# Patient Record
Sex: Female | Born: 1970 | Race: White | Hispanic: No | Marital: Married | Smoking: Former smoker
Health system: Southern US, Community
[De-identification: ages and names within clinical notes are randomized; demographics above are authoritative.]

## PROBLEM LIST (undated history)

## (undated) DIAGNOSIS — F410 Panic disorder [episodic paroxysmal anxiety] without agoraphobia: Secondary | ICD-10-CM

## (undated) DIAGNOSIS — E785 Hyperlipidemia, unspecified: Secondary | ICD-10-CM

## (undated) DIAGNOSIS — I10 Essential (primary) hypertension: Secondary | ICD-10-CM

## (undated) HISTORY — DX: Panic disorder (episodic paroxysmal anxiety): F41.0

## (undated) HISTORY — DX: Hyperlipidemia, unspecified: E78.5

## (undated) HISTORY — DX: Essential (primary) hypertension: I10

---

## 1993-11-18 HISTORY — PX: WISDOM TOOTH EXTRACTION: SHX21

## 1993-11-18 HISTORY — PX: BUNIONECTOMY: SHX129

## 1998-06-20 ENCOUNTER — Ambulatory Visit (HOSPITAL_COMMUNITY): Admission: RE | Admit: 1998-06-20 | Discharge: 1998-06-20 | Payer: Self-pay | Admitting: Obstetrics and Gynecology

## 1998-08-16 ENCOUNTER — Other Ambulatory Visit: Admission: RE | Admit: 1998-08-16 | Discharge: 1998-08-16 | Payer: Self-pay | Admitting: Obstetrics and Gynecology

## 1999-03-07 ENCOUNTER — Encounter: Payer: Self-pay | Admitting: Obstetrics and Gynecology

## 1999-03-07 ENCOUNTER — Inpatient Hospital Stay (HOSPITAL_COMMUNITY): Admission: AD | Admit: 1999-03-07 | Discharge: 1999-03-10 | Payer: Self-pay | Admitting: Obstetrics and Gynecology

## 1999-06-12 ENCOUNTER — Inpatient Hospital Stay (HOSPITAL_COMMUNITY): Admission: AD | Admit: 1999-06-12 | Discharge: 1999-06-15 | Payer: Self-pay | Admitting: Obstetrics and Gynecology

## 1999-06-12 ENCOUNTER — Encounter (INDEPENDENT_AMBULATORY_CARE_PROVIDER_SITE_OTHER): Payer: Self-pay

## 1999-08-07 ENCOUNTER — Encounter (HOSPITAL_COMMUNITY): Admission: RE | Admit: 1999-08-07 | Discharge: 1999-11-05 | Payer: Self-pay | Admitting: Obstetrics and Gynecology

## 1999-11-07 ENCOUNTER — Encounter (HOSPITAL_COMMUNITY): Admission: RE | Admit: 1999-11-07 | Discharge: 2000-02-05 | Payer: Self-pay | Admitting: Obstetrics and Gynecology

## 2000-03-07 ENCOUNTER — Encounter: Admission: RE | Admit: 2000-03-07 | Discharge: 2000-03-18 | Payer: Self-pay | Admitting: Obstetrics and Gynecology

## 2000-09-07 ENCOUNTER — Emergency Department (HOSPITAL_COMMUNITY): Admission: EM | Admit: 2000-09-07 | Discharge: 2000-09-07 | Payer: Self-pay | Admitting: *Deleted

## 2001-01-23 ENCOUNTER — Encounter: Payer: Self-pay | Admitting: Obstetrics and Gynecology

## 2001-01-23 ENCOUNTER — Encounter: Admission: RE | Admit: 2001-01-23 | Discharge: 2001-01-23 | Payer: Self-pay | Admitting: Obstetrics and Gynecology

## 2001-01-30 ENCOUNTER — Other Ambulatory Visit: Admission: RE | Admit: 2001-01-30 | Discharge: 2001-01-30 | Payer: Self-pay | Admitting: Obstetrics and Gynecology

## 2001-03-31 ENCOUNTER — Other Ambulatory Visit: Admission: RE | Admit: 2001-03-31 | Discharge: 2001-03-31 | Payer: Self-pay | Admitting: Obstetrics and Gynecology

## 2002-04-09 ENCOUNTER — Other Ambulatory Visit: Admission: RE | Admit: 2002-04-09 | Discharge: 2002-04-09 | Payer: Self-pay | Admitting: Obstetrics and Gynecology

## 2004-01-18 ENCOUNTER — Other Ambulatory Visit: Admission: RE | Admit: 2004-01-18 | Discharge: 2004-01-18 | Payer: Self-pay | Admitting: Obstetrics and Gynecology

## 2005-02-12 ENCOUNTER — Other Ambulatory Visit: Admission: RE | Admit: 2005-02-12 | Discharge: 2005-02-12 | Payer: Self-pay | Admitting: Obstetrics and Gynecology

## 2006-04-17 ENCOUNTER — Other Ambulatory Visit: Admission: RE | Admit: 2006-04-17 | Discharge: 2006-04-17 | Payer: Self-pay | Admitting: Obstetrics and Gynecology

## 2007-01-06 ENCOUNTER — Ambulatory Visit (HOSPITAL_COMMUNITY): Admission: RE | Admit: 2007-01-06 | Discharge: 2007-01-06 | Payer: Self-pay | Admitting: Obstetrics and Gynecology

## 2007-05-15 ENCOUNTER — Other Ambulatory Visit: Admission: RE | Admit: 2007-05-15 | Discharge: 2007-05-15 | Payer: Self-pay | Admitting: Obstetrics and Gynecology

## 2008-05-18 LAB — CONVERTED CEMR LAB: Pap Smear: NORMAL

## 2008-05-24 ENCOUNTER — Emergency Department (HOSPITAL_COMMUNITY): Admission: EM | Admit: 2008-05-24 | Discharge: 2008-05-24 | Payer: Self-pay | Admitting: Emergency Medicine

## 2008-05-27 ENCOUNTER — Other Ambulatory Visit: Admission: RE | Admit: 2008-05-27 | Discharge: 2008-05-27 | Payer: Self-pay | Admitting: Obstetrics and Gynecology

## 2008-10-07 ENCOUNTER — Emergency Department (HOSPITAL_COMMUNITY): Admission: EM | Admit: 2008-10-07 | Discharge: 2008-10-07 | Payer: Self-pay | Admitting: Emergency Medicine

## 2009-01-02 ENCOUNTER — Encounter: Payer: Self-pay | Admitting: Family Medicine

## 2009-03-16 ENCOUNTER — Ambulatory Visit: Payer: Self-pay | Admitting: Family Medicine

## 2009-03-16 DIAGNOSIS — F410 Panic disorder [episodic paroxysmal anxiety] without agoraphobia: Secondary | ICD-10-CM

## 2009-03-16 DIAGNOSIS — I10 Essential (primary) hypertension: Secondary | ICD-10-CM

## 2009-03-16 DIAGNOSIS — E785 Hyperlipidemia, unspecified: Secondary | ICD-10-CM

## 2009-03-16 HISTORY — DX: Hyperlipidemia, unspecified: E78.5

## 2009-03-16 HISTORY — DX: Essential (primary) hypertension: I10

## 2009-03-16 HISTORY — DX: Panic disorder (episodic paroxysmal anxiety): F41.0

## 2009-03-20 ENCOUNTER — Telehealth: Payer: Self-pay | Admitting: Family Medicine

## 2009-07-05 ENCOUNTER — Ambulatory Visit: Payer: Self-pay | Admitting: Family Medicine

## 2009-07-05 DIAGNOSIS — R5383 Other fatigue: Secondary | ICD-10-CM

## 2009-07-05 DIAGNOSIS — R5381 Other malaise: Secondary | ICD-10-CM

## 2009-08-02 ENCOUNTER — Ambulatory Visit: Payer: Self-pay | Admitting: Family Medicine

## 2009-08-02 DIAGNOSIS — G5600 Carpal tunnel syndrome, unspecified upper limb: Secondary | ICD-10-CM

## 2009-10-11 ENCOUNTER — Ambulatory Visit (HOSPITAL_COMMUNITY): Admission: RE | Admit: 2009-10-11 | Discharge: 2009-10-11 | Payer: Self-pay | Admitting: Obstetrics and Gynecology

## 2009-10-24 ENCOUNTER — Ambulatory Visit: Payer: Self-pay | Admitting: Family Medicine

## 2009-12-22 ENCOUNTER — Ambulatory Visit: Payer: Self-pay | Admitting: Family Medicine

## 2010-04-26 IMAGING — CR DG CHEST 2V
2 series · 2 of 2 positions shown · non-contrast
Comparison: 05/24/2008

CLINICAL DATA: Chest discomfort, chest burning

CHEST - 2 VIEW

[w chest pa]
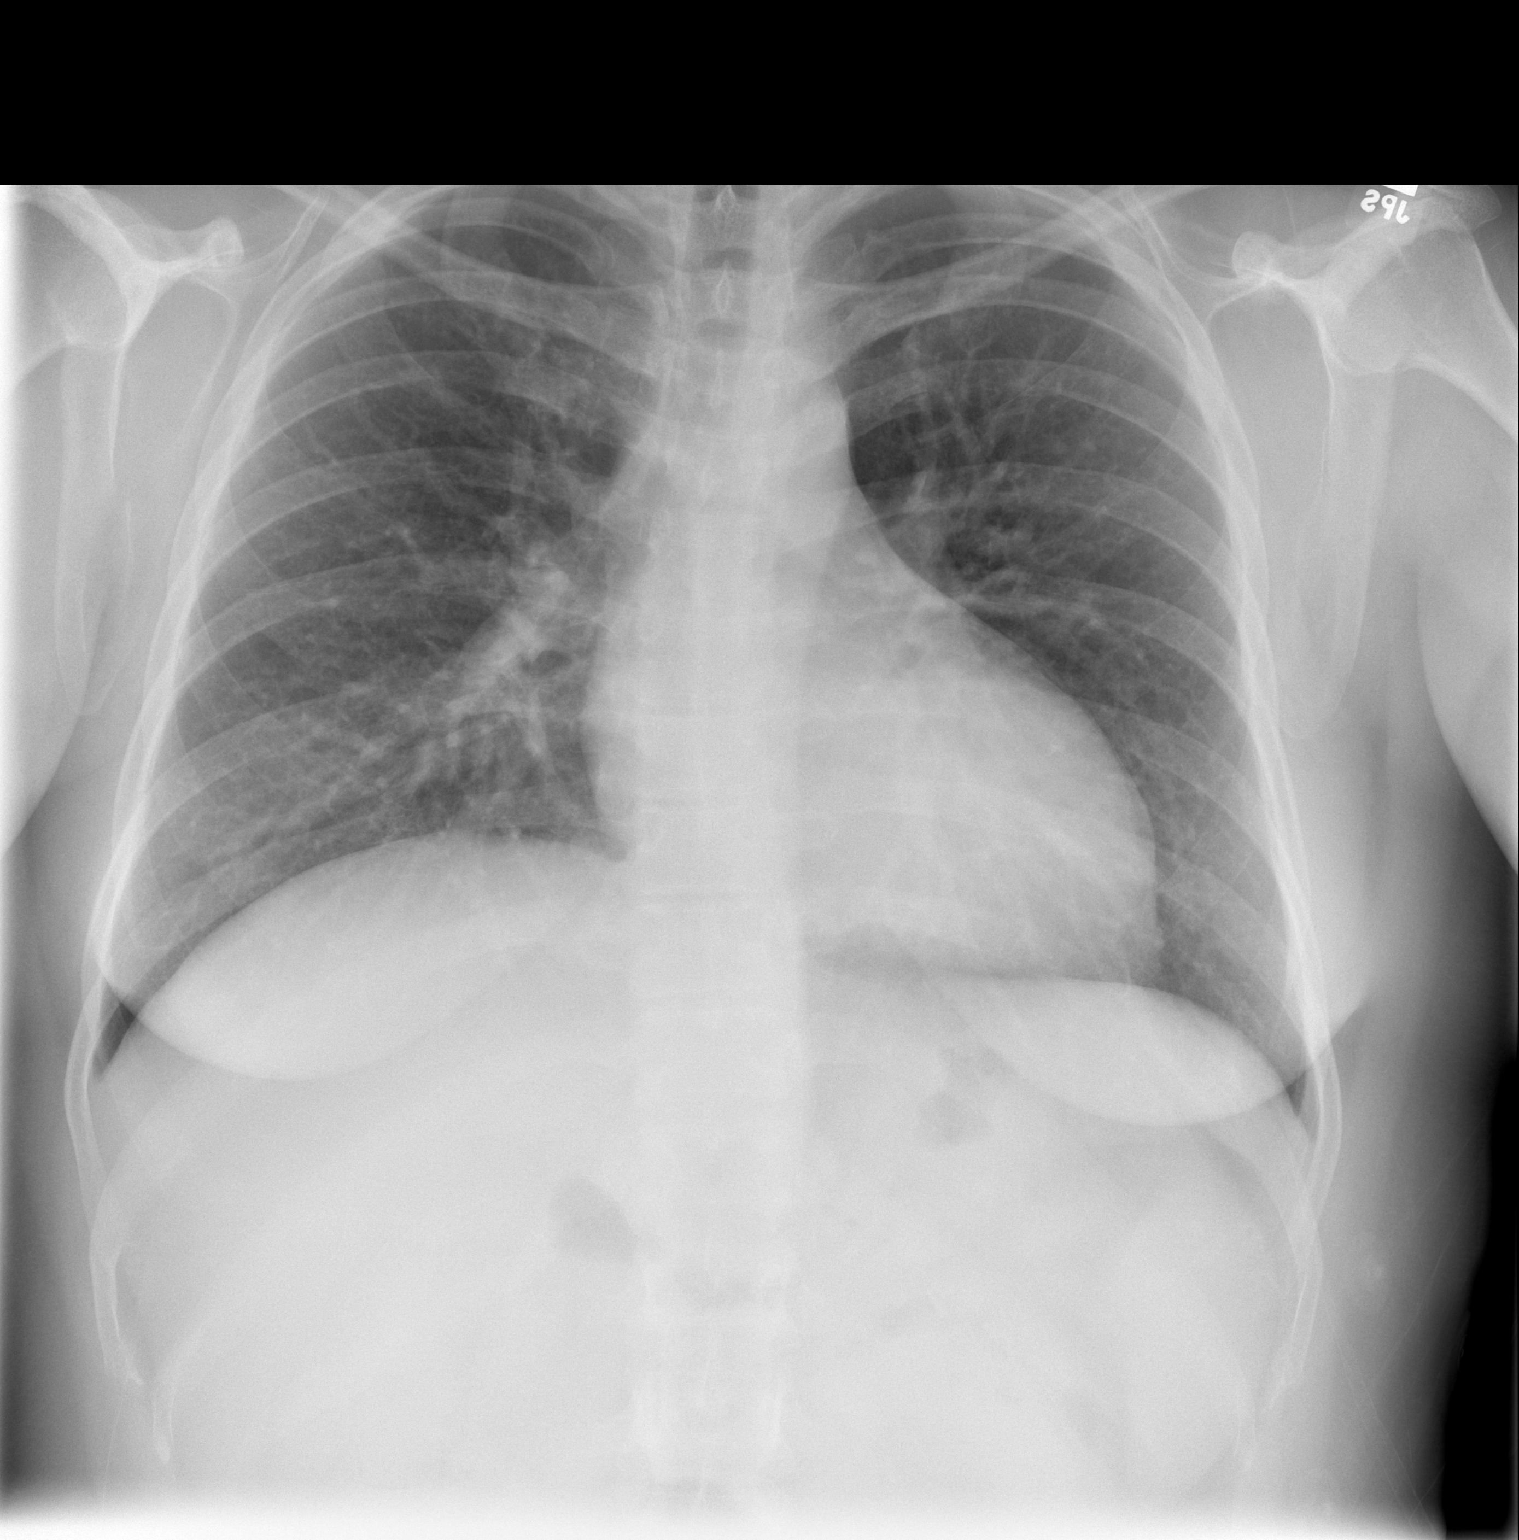

[w chest lat]
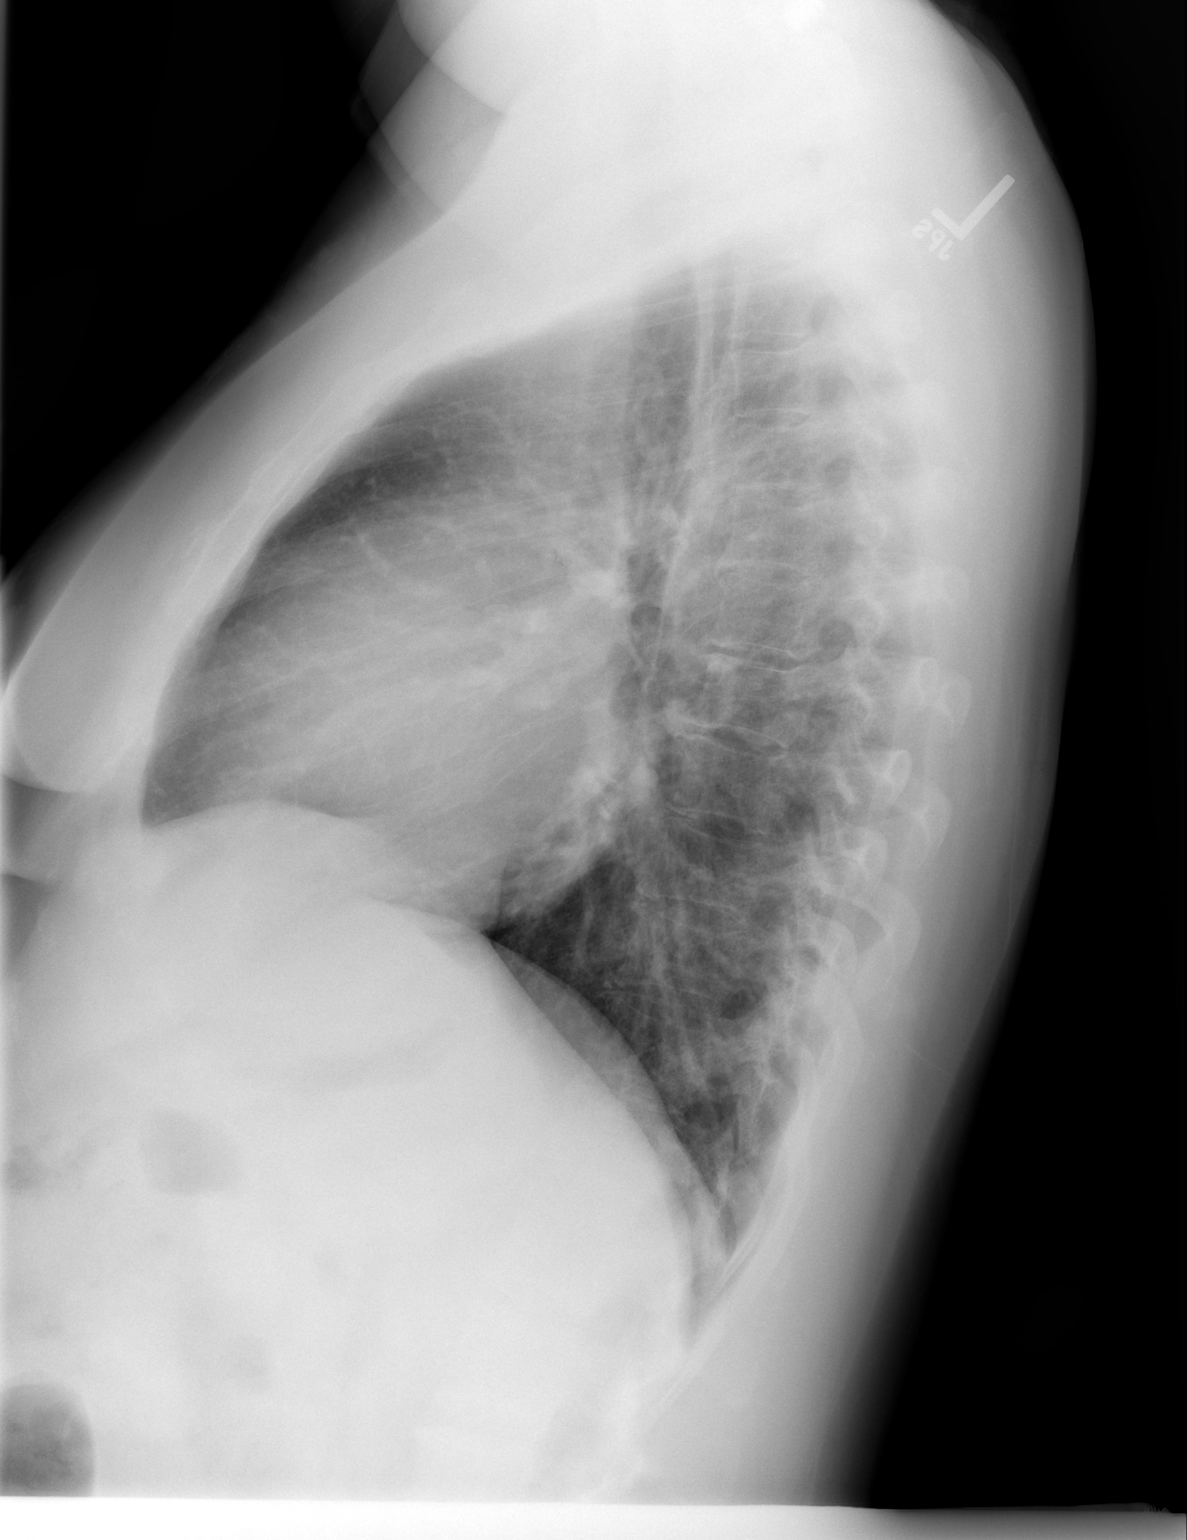

[2 of 2 positions shown; findings below may reference images not displayed]

FINDINGS: Cardiomediastinal silhouette is stable.  No acute
infiltrate or pleural effusion.  No pulmonary edema.  Mild central
increased bronchial markings is stable. Bony thorax is stable.
IMPRESSION: No active disease.  No significant change.

## 2010-05-16 ENCOUNTER — Ambulatory Visit: Payer: Self-pay | Admitting: Family Medicine

## 2010-12-18 NOTE — Assessment & Plan Note (Signed)
Summary: renew meds//ccm   Vital Signs:  Patient profile:   40 year old female Menstrual status:  irregular Weight:      204 pounds Temp:     98.9 degrees F oral BP sitting:   130 / 88  (left arm) Cuff size:   large  Vitals Entered By: Sid Falcon LPN (December 22, 2009 1:50 PM) CC: Med follow-up   History of Present Illness: Followup anxiety issues. History panic disorder. Patient feels improved on Prozac but still has some anxiety symptoms. Requesting titration. No side effects from medication. Using Ativan mostly to help with sleep at night. Still has occasional breakthrough panic symptoms at night. Also requesting refill blood pressure medication. Exercising several days per week.  Allergies (verified): No Known Drug Allergies  Past History:  Past Medical History: Last updated: 03/16/2009 Depression Hay Fever/Allergies Hyperlipidemia Hypertension UTI Panic Attacks PMH reviewed for relevance  Review of Systems      See HPI  Physical Exam  General:  Well-developed,well-nourished,in no acute distress; alert,appropriate and cooperative throughout examination Mouth:  Oral mucosa and oropharynx without lesions or exudates.  Teeth in good repair. Neck:  No deformities, masses, or tenderness noted. Lungs:  Normal respiratory effort, chest expands symmetrically. Lungs are clear to auscultation, no crackles or wheezes. Heart:  Normal rate and regular rhythm. S1 and S2 normal without gallop, murmur, click, rub or other extra sounds. Psych:  normally interactive, good eye contact, not anxious appearing, and not depressed appearing.     Impression & Recommendations:  Problem # 1:  PANIC DISORDER (ICD-300.01) Assessment Improved titrate prozac to 40 mg. Her updated medication list for this problem includes:    Fluoxetine Hcl 40 Mg Caps (Fluoxetine hcl) ..... One by mouth once daily    Ativan 1 Mg Tabs (Lorazepam) .Marland Kitchen... As needed  Problem # 2:  HYPERTENSION  (ICD-401.9) Assessment: Unchanged  Her updated medication list for this problem includes:    Ziac 2.5-6.25 Mg Tabs (Bisoprolol-hydrochlorothiazide) ..... Once daily  Complete Medication List: 1)  Ziac 2.5-6.25 Mg Tabs (Bisoprolol-hydrochlorothiazide) .... Once daily 2)  Fluoxetine Hcl 40 Mg Caps (Fluoxetine hcl) .... One by mouth once daily 3)  Ativan 1 Mg Tabs (Lorazepam) .... As needed  Patient Instructions: 1)  Please schedule a follow-up appointment in 6 months .  2)  It is important that you exercise reguarly at least 20 minutes 5 times a week. If you develop chest pain, have severe difficulty breathing, or feel very tired, stop exercising immediately and seek medical attention.  3)  You need to lose weight. Consider a lower calorie diet and regular exercise.  Prescriptions: ZIAC 2.5-6.25 MG TABS (BISOPROLOL-HYDROCHLOROTHIAZIDE) once daily  #30 x 11   Entered and Authorized by:   Evelena Peat MD   Signed by:   Evelena Peat MD on 12/22/2009   Method used:   Electronically to        [facility]  [phone]* (retail)       [address]       [place], [place]  [address]       Ph: [phone] or [phone]       Fax: [phone]   RxID:   [id number] ATIVAN 1 MG TABS (LORAZEPAM) as needed  #30 x 5   Entered and Authorized by:   Evelena Peat MD   Signed by:   Evelena Peat MD on 12/22/2009   Method used:   Print then Give to  Patient   RxID:   [id number] FLUOXETINE HCL 40 MG CAPS (FLUOXETINE HCL) one by mouth once daily  #30 x 11   Entered and Authorized by:   Evelena Peat MD   Signed by:   Evelena Peat MD on 12/22/2009   Method used:   Electronically to        [facility]  [phone]* (retail)       [address]       [place], [place]  [address]       Ph: [phone] or [phone]       Fax: [phone]   RxID:   [id number]

## 2010-12-18 NOTE — Assessment & Plan Note (Addendum)
Summary: med check/consult/cjr   Vital Signs:  Patient profile:   40 year old female Menstrual status:  irregular Weight:      199 pounds Temp:     98.4 degrees F oral BP sitting:   116 / 72  Vitals Entered By: Sid Falcon LPN (May 16, 2010 8:53 AM)  History of Present Illness: Episode last week of palpitations at rest and anxiety symptoms. No signif episodes since then.  No chest pain.  Hx panic disorder and for most part controlled on Prozac.  Still feels anxious but no full blown panic attacks.    hypertension treated with Ziac and well controlled.  No side effects from medication.  Compliant with meds.  Hypertension History:      She complains of palpitations, but denies headache, chest pain, dyspnea with exertion, orthopnea, PND, peripheral edema, visual symptoms, neurologic problems, syncope, and side effects from treatment.  She notes no problems with any antihypertensive medication side effects.        Positive major cardiovascular risk factors include hyperlipidemia and hypertension.  Negative major cardiovascular risk factors include female age less than 77 years old.     Allergies (verified): No Known Drug Allergies  Past History:  Past Medical History: Last updated: 03/16/2009 Depression Hay Fever/Allergies Hyperlipidemia Hypertension UTI Panic Attacks  Social History: Last updated: 03/16/2009 Occupation:  Admin Asst Married Alcohol use-no Previous smoker  Review of Systems  The patient denies anorexia, weight loss, weight gain, chest pain, syncope, dyspnea on exertion, peripheral edema, prolonged cough, headaches, and abdominal pain.    Physical Exam  General:  Well-developed,well-nourished,in no acute distress; alert,appropriate and cooperative throughout examination Head:  Normocephalic and atraumatic without obvious abnormalities. No apparent alopecia or balding. Mouth:  Oral mucosa and oropharynx without lesions or exudates.  Teeth in good  repair. Neck:  No deformities, masses, or tenderness noted. Lungs:  Normal respiratory effort, chest expands symmetrically. Lungs are clear to auscultation, no crackles or wheezes. Heart:  Normal rate and regular rhythm. S1 and S2 normal without gallop, murmur, click, rub or other extra sounds. Extremities:  no edema. Psych:  normally interactive, good eye contact, not anxious appearing, and not depressed appearing.     Impression & Recommendations:  Problem # 1:  HYPERTENSION (ICD-401.9) stable. cont current meds. Her updated medication list for this problem includes:    Ziac 2.5-6.25 Mg Tabs (Bisoprolol-hydrochlorothiazide) ..... Once daily  Problem # 2:  PANIC DISORDER (ICD-300.01) for the most past controlled.  cont to supplement with Lorazepam as needed.  Her updated medication list for this problem includes:    Fluoxetine Hcl 40 Mg Caps (Fluoxetine hcl) ..... One by mouth once daily    Ativan 1 Mg Tabs (Lorazepam) .Marland Kitchen... As needed  Complete Medication List: 1)  Ziac 2.5-6.25 Mg Tabs (Bisoprolol-hydrochlorothiazide) .... Once daily 2)  Fluoxetine Hcl 40 Mg Caps (Fluoxetine hcl) .... One by mouth once daily 3)  Ativan 1 Mg Tabs (Lorazepam) .... As needed 4)  Clobex Spray 0.05 % Liqd (Clobetasol propionate) .... Spray to ankles and ears as needed  for psoraisis  Hypertension Assessment/Plan:      The patient's hypertensive risk group is category B: At least one risk factor (excluding diabetes) with no target organ damage.  Today's blood pressure is 116/72.    Patient Instructions: 1)  Please schedule a follow-up appointment in 6 months .

## 2010-12-31 ENCOUNTER — Telehealth: Payer: Self-pay | Admitting: *Deleted

## 2010-12-31 NOTE — Telephone Encounter (Signed)
Pt calls stating she took the test for an UTI and was positive.  Would like to be treated as she cannot come into the office. Per Dr. Caryl Never, must come into office.  Could not come until Wed.  Appt scheduled.

## 2010-12-31 NOTE — Telephone Encounter (Signed)
[facility] Triage Call Report Triage Record Num: [id number] Operator: Karenann Cai Patient Name: [facility] Call Date & Time: 12/30/2010 1:59:01PM Patient Phone: [phone] PCP: Evelena Peat Patient Gender: Female PCP Fax : [phone] Patient DOB: 03/03/1971 [facility] Name: [facility] Reason for Call: Patient is calling to report that she has urinary frequency and burning x 48 hours. Afebrile. LMP: ablation. RN reviewed urinary symptoms (female) care advice with patient. Patient advised to go to UC today or call office staff in the am for an appointment. Protocol(s) Used: Urinary Symptoms - Female Recommended Outcome per Protocol: See Provider within 24 hours Reason for Outcome: Has one or more urinary tract symptoms Care Advice: ~ 12/30/2010 2:05:32PM Page 1 of 1 [email or web address]

## 2011-01-01 ENCOUNTER — Encounter: Payer: Self-pay | Admitting: Family Medicine

## 2011-01-02 ENCOUNTER — Encounter: Payer: Self-pay | Admitting: Family Medicine

## 2011-01-02 ENCOUNTER — Ambulatory Visit (INDEPENDENT_AMBULATORY_CARE_PROVIDER_SITE_OTHER): Payer: 59 | Admitting: Family Medicine

## 2011-01-02 VITALS — BP 130/88 | Temp 98.2°F | Ht 62.25 in | Wt 202.0 lb

## 2011-01-02 DIAGNOSIS — R300 Dysuria: Secondary | ICD-10-CM

## 2011-01-02 DIAGNOSIS — F410 Panic disorder [episodic paroxysmal anxiety] without agoraphobia: Secondary | ICD-10-CM

## 2011-01-02 LAB — POCT URINALYSIS DIPSTICK
Bilirubin, UA: NEGATIVE
Glucose, UA: NEGATIVE
Ketones, UA: NEGATIVE
Nitrite, UA: NEGATIVE

## 2011-01-02 MED ORDER — FLUOXETINE HCL 40 MG PO CAPS: 40.0000 mg | ORAL_CAPSULE | Freq: Every day | ORAL | Status: DC

## 2011-01-02 MED ORDER — NITROFURANTOIN MONOHYD MACRO 100 MG PO CAPS: 100.0000 mg | ORAL_CAPSULE | Freq: Two times a day (BID) | ORAL | Status: AC

## 2011-01-02 NOTE — Patient Instructions (Signed)
Urinary Tract Infection (UTI)   Infections in the urinary tract can start in several places. A bladder infection (cystitis), a kidney infection (pyelonephritis), and a prostate infection (prostatitis) are different types of urinary tract infections. They usually get better if treated with antibiotics. Antibiotics are medicines that can help kill bacteria germs. Take all the medicine given to you until it is gone. You may feel better in a few days, but TAKE ALL MEDICINE or the infection may not respond and become more difficult to treat.    HOME CARE INSTRUCTIONS  Drink a lot of fluid, 3 to 4 quarts a day. Cranberry juice is especially recommended, in addition to large amounts of water.  Avoid caffeine, tea and carbonated beverages. They tend to irritate the bladder.  Alcohol may irritate the prostate.  Only take over-the-counter or prescription medicines for pain, discomfort, or fever as directed by your caregiver.   FINDING OUT THE RESULTS OF YOUR TEST Not all test results are available during your visit. If your test results are not back during the visit, make an appointment with your caregiver to find out the results. Do not assume everything is normal if you have not heard from your caregiver or the medical facility. It is important for you to follow up on all of your test results.    TO PREVENT FURTHER INFECTIONS:  Empty the bladder often. Avoid holding urine for long periods of time.  After a bowel movement, women should cleanse from front to back. Use each tissue only once.  Empty the bladder before and after sexual intercourse.  If you develop back pain, fever, sickness to your stomach (nausea), vomiting, or your problems (symptoms) are no better in 3 days, return to your caregiver. Return sooner if you are getting worse.   SEEK IMMEDIATE MEDICAL CARE IF YOU:  Develop severe back pain or lower abdominal pain.  Develop chills and fever.  Develop nausea or vomiting.  Have  continued burning or discomfort with urination.   MAKE SURE YOU:   Understand these instructions.   Will watch your condition.  Will get help right away if you are not doing well or get worse.   Document Released: 08/14/2005  Document Re-Released: 11/26/2009 [employer] Patient Information 2011 [employer]. 

## 2011-01-02 NOTE — Progress Notes (Signed)
  Subjective:    Patient ID: Melissa Vasquez, female    DOB: 1971-02-22, 40 y.o.   MRN: [id number]  HPI   Patient seen with symptoms of possible urinary tract infection. Approximately four-day history of some burning with urination and frequency. Occasional hematuria with wiping. Has tried increased cranberry juice with minimal improvement. The any fever, nausea, vomiting, or back pain.   Patient also requests refills of Prozac. History of panic disorder which has been well controlled. Takes lorazepam as needed and rarely for panic symptoms  Review of Systems  as per history of present illness    Objective:   Physical Exam     Patient alert and in no distress Oropharynx is moist and clear Chest clear to auscultation Heart regular rhythm and rate Back no CVA tenderness    Assessment & Plan:   #1 UTI. Macrobid one twice a day for 5 days #2 panic disorder stable. Refill Prozac for one year

## 2011-01-02 NOTE — Progress Notes (Signed)
Addended by: Sid Falcon on: 01/02/2011 09:30 AM   Modules accepted: Orders

## 2011-02-19 ENCOUNTER — Other Ambulatory Visit: Payer: Self-pay | Admitting: Obstetrics and Gynecology

## 2011-02-20 LAB — BASIC METABOLIC PANEL
BUN: 11 mg/dL (ref 6–23)
GFR calc non Af Amer: >60 mL/min (ref >60)
Glucose, Bld: 92 mg/dL (ref 70–99)
Potassium: 4 mEq/L (ref 3.5–5.1)

## 2011-02-20 LAB — CBC
HCT: 40.7 % (ref 36.0–46.0)
Hemoglobin: 13.7 g/dL (ref 12.0–15.0)
RDW: 12.8 % (ref 11.5–15.5)

## 2011-03-21 ENCOUNTER — Other Ambulatory Visit: Payer: Self-pay | Admitting: Family Medicine

## 2011-04-16 ENCOUNTER — Ambulatory Visit (INDEPENDENT_AMBULATORY_CARE_PROVIDER_SITE_OTHER): Payer: 59 | Admitting: Family Medicine

## 2011-04-16 ENCOUNTER — Encounter: Payer: Self-pay | Admitting: Family Medicine

## 2011-04-16 DIAGNOSIS — F410 Panic disorder [episodic paroxysmal anxiety] without agoraphobia: Secondary | ICD-10-CM

## 2011-04-16 DIAGNOSIS — I10 Essential (primary) hypertension: Secondary | ICD-10-CM

## 2011-04-16 DIAGNOSIS — E785 Hyperlipidemia, unspecified: Secondary | ICD-10-CM

## 2011-04-16 DIAGNOSIS — G4700 Insomnia, unspecified: Secondary | ICD-10-CM

## 2011-04-16 LAB — LIPID PANEL
Cholesterol: 221 mg/dL — ABNORMAL HIGH (ref 0–200)
HDL: 44.2 mg/dL (ref >39.00)
Total CHOL/HDL Ratio: 5
Triglycerides: 643 mg/dL — ABNORMAL HIGH (ref 0.0–149.0)
VLDL: 128.6 mg/dL — ABNORMAL HIGH (ref 0.0–40.0)

## 2011-04-16 LAB — LDL CHOLESTEROL, DIRECT: Direct LDL: 92.4 mg/dL

## 2011-04-16 MED ORDER — BISOPROLOL-HYDROCHLOROTHIAZIDE 2.5-6.25 MG PO TABS: 1.0000 | ORAL_TABLET | Freq: Every day | ORAL | Status: DC

## 2011-04-16 MED ORDER — LORAZEPAM 1 MG PO TABS: 1.0000 mg | ORAL_TABLET | Freq: Three times a day (TID) | ORAL | Status: DC | PRN

## 2011-04-16 NOTE — Progress Notes (Signed)
  Subjective:    Patient ID: Melissa Vasquez, female    DOB: [birth year], 40 y.o.   MRN: [id number]  HPI Patient seen for medical followup. Has history of chronic anxiety and panic disorder. Currently stable on fluoxetine 40 mg daily. Supplements with lorazepam, mostly at night. Usually takes 0.5 mg daily. No recent increased anxiety symptoms. Denies depressive symptoms.  Hypertension treated with Ziac 2.5/6.25 mg one daily. No recent orthostasis. Blood pressure well-controlled.  Recent problems with some insomnia. Denies depression. Difficulty falling asleep and some early morning awakening. No significant caffeine use. No alcohol use. Denies specific stressors.   Review of Systems  Constitutional: Negative for fever, activity change, appetite change, fatigue and unexpected weight change.  Respiratory: Negative for shortness of breath and wheezing.   Cardiovascular: Negative for chest pain, palpitations and leg swelling.  Genitourinary: Negative for dysuria.  Psychiatric/Behavioral: Negative for dysphoric mood.       Objective:   Physical Exam  Constitutional: She is oriented to person, place, and time. She appears well-developed and well-nourished. No distress.  HENT:  Mouth/Throat: Oropharynx is clear and moist.  Neck: Neck supple. No thyromegaly present.  Cardiovascular: Normal rate, regular rhythm and normal heart sounds.   No murmur heard. Pulmonary/Chest: Effort normal and breath sounds normal. No respiratory distress. She has no wheezes. She has no rales.  Musculoskeletal: She exhibits no edema.  Lymphadenopathy:    She has no cervical adenopathy.  Neurological: She is alert and oriented to person, place, and time.  Psychiatric: She has a normal mood and affect. Her behavior is normal.          Assessment & Plan:  #1 hypertension. Stable. Refill medication for one year #2 history of hyperlipidemia. Recheck fasting lipids. Diet discussed. #3 intermittent insomnia. Sleep  hygiene discussed. Refill lorazepam to use as needed #4 panic disorder controlled on stable. Continue fluoxetine 40 mg daily

## 2011-04-17 NOTE — Progress Notes (Signed)
Quick Note:  Pt informed on home VM I would send a copy of labs to her home with instructions ______

## 2011-08-15 LAB — URINALYSIS, ROUTINE W REFLEX MICROSCOPIC
Bilirubin Urine: NEGATIVE
Glucose, UA: NEGATIVE
Hgb urine dipstick: NEGATIVE
Protein, ur: NEGATIVE
Urobilinogen, UA: 0.2

## 2011-08-15 LAB — BASIC METABOLIC PANEL
CO2: 25
Calcium: 9.3
Chloride: 104
Creatinine, Ser: 0.62
GFR calc Af Amer: >60
GFR calc non Af Amer: >60
Glucose, Bld: 146 — ABNORMAL HIGH
Potassium: 4.1

## 2011-08-15 LAB — DIFFERENTIAL
Basophils Absolute: 0
Basophils Relative: 0
Eosinophils Absolute: 0.1
Eosinophils Relative: 1
Neutrophils Relative %: 75

## 2011-08-15 LAB — D-DIMER, QUANTITATIVE: D-Dimer, Quant: 0.36

## 2011-08-15 LAB — CBC
Hemoglobin: 14.5
MCHC: 34.4
MCV: 88.2
RBC: 4.77
RDW: 12.5
WBC: 8.9

## 2011-08-20 LAB — BASIC METABOLIC PANEL
BUN: 13
Calcium: 9.4
Chloride: 103
Creatinine, Ser: 0.77
GFR calc Af Amer: >60

## 2011-08-20 LAB — URINALYSIS, ROUTINE W REFLEX MICROSCOPIC
Glucose, UA: NEGATIVE
Hgb urine dipstick: NEGATIVE
Ketones, ur: NEGATIVE
Protein, ur: NEGATIVE
Urobilinogen, UA: 0.2

## 2011-08-20 LAB — POCT CARDIAC MARKERS: Troponin i, poc: <0.05

## 2011-08-20 LAB — DIFFERENTIAL
Basophils Relative: 0
Eosinophils Absolute: 0.1
Lymphs Abs: 2.2
Neutro Abs: 5.2
Neutrophils Relative %: 66

## 2011-08-20 LAB — TSH: TSH: 7.465 — ABNORMAL HIGH

## 2011-08-20 LAB — T3, FREE: T3, Free: 3.4 (ref 2.3–4.2)

## 2011-08-20 LAB — CBC
MCV: 87.5
Platelets: 236
RBC: 4.95
WBC: 7.9

## 2011-08-20 LAB — T4, FREE: Free T4: 1.24

## 2011-08-20 LAB — D-DIMER, QUANTITATIVE: D-Dimer, Quant: 0.34

## 2011-09-03 ENCOUNTER — Ambulatory Visit (INDEPENDENT_AMBULATORY_CARE_PROVIDER_SITE_OTHER): Payer: 59 | Admitting: Family Medicine

## 2011-09-03 ENCOUNTER — Encounter: Payer: Self-pay | Admitting: Family Medicine

## 2011-09-03 VITALS — BP 110/78 | Temp 98.5°F | Wt 200.0 lb

## 2011-09-03 DIAGNOSIS — R5383 Other fatigue: Secondary | ICD-10-CM

## 2011-09-03 DIAGNOSIS — Z23 Encounter for immunization: Secondary | ICD-10-CM

## 2011-09-03 DIAGNOSIS — R5381 Other malaise: Secondary | ICD-10-CM

## 2011-09-03 DIAGNOSIS — F410 Panic disorder [episodic paroxysmal anxiety] without agoraphobia: Secondary | ICD-10-CM

## 2011-09-03 DIAGNOSIS — E785 Hyperlipidemia, unspecified: Secondary | ICD-10-CM

## 2011-09-03 LAB — BASIC METABOLIC PANEL
GFR: 111.51 mL/min (ref >60.00)
Potassium: 4.1 mEq/L (ref 3.5–5.1)
Sodium: 138 mEq/L (ref 135–145)

## 2011-09-03 LAB — LIPID PANEL
Cholesterol: 206 mg/dL — ABNORMAL HIGH (ref 0–200)
HDL: 40.3 mg/dL (ref >39.00)
Triglycerides: 511 mg/dL — ABNORMAL HIGH (ref 0.0–149.0)
VLDL: 102.2 mg/dL — ABNORMAL HIGH (ref 0.0–40.0)

## 2011-09-03 NOTE — Progress Notes (Signed)
  Subjective:    Patient ID: Melissa Vasquez, female    DOB: Jun 03, 1971, 40 y.o.   MRN: [id number]  HPI Patient seen with increased fatigue over several weeks and especially past several days. History of some chronic insomnia. Uses lorazepam at night which helps somewhat. Also chronic anxiety. Frequently feels jittery and anxious. Takes fluoxetine 40 mg daily. Denies depressive symptoms. In reviewing previous labs we came across a prior TSH over 7 back in 2009. This was done apparently during one of her hospital visits. No repeat since then. Does have occasional constipation and occasional cold intolerance. No recent weight gain. Not exercising regularly.  Occasional urine frequency. No thirst. No weight loss. History dyslipidemia. Triglycerides over 600. Started omega-3 supplement after her last labs.  Past Medical History  Diagnosis Date  . HYPERLIPIDEMIA 03/16/2009  . PANIC DISORDER 03/16/2009  . HYPERTENSION 03/16/2009   Past Surgical History  Procedure Date  . Bunionectomy 1995    bilateral  . Wisdom tooth extraction 1995    reports that she quit smoking about 2 years ago. Her smoking use included Cigarettes. She has a 3 pack-year smoking history. She does not have any smokeless tobacco history on file. Her alcohol and drug histories not on file. family history includes Alcohol abuse in her other; Diabetes in her other; Heart disease in her other; Hyperlipidemia in her other; Hypertension in her other; and Mental illness in her other. No Known Allergies    Review of Systems  Constitutional: Positive for fatigue. Negative for fever, appetite change and unexpected weight change.  HENT: Negative for trouble swallowing and voice change.   Eyes: Negative for visual disturbance.  Respiratory: Negative for cough and shortness of breath.   Cardiovascular: Negative for chest pain and leg swelling.  Gastrointestinal: Positive for constipation. Negative for nausea, vomiting and abdominal pain.    Genitourinary: Negative for dysuria and hematuria.  Skin: Negative for rash.  Neurological: Negative for dizziness and headaches.  Psychiatric/Behavioral: The patient is nervous/anxious.        Objective:   Physical Exam  Constitutional: She is oriented to person, place, and time. She appears well-developed and well-nourished.  HENT:  Mouth/Throat: Oropharynx is clear and moist.  Neck: Neck supple. No thyromegaly present.  Cardiovascular: Normal rate, regular rhythm and normal heart sounds.   No murmur heard. Pulmonary/Chest: Effort normal and breath sounds normal. No respiratory distress. She has no wheezes. She has no rales.  Musculoskeletal: She exhibits no edema.  Lymphadenopathy:    She has no cervical adenopathy.  Neurological: She is alert and oriented to person, place, and time. She has normal reflexes. No cranial nerve deficit.  Psychiatric: She has a normal mood and affect. Her behavior is normal.          Assessment & Plan:  #1 fatigue. Possibly multifactorial. Need to rule out hypothyroidism with elevated TSH back in 2009. Check TSH and basic metabolic panel #2 dyslipidemia. Rule out secondary causes such as hypothyroidism as above. Repeat lipids today #3 history of chronic anxiety and probable panic disorder. Fairly well controlled with fluoxetine

## 2011-09-05 ENCOUNTER — Telehealth: Payer: Self-pay | Admitting: *Deleted

## 2011-09-05 NOTE — Progress Notes (Signed)
Quick Note:  Pt informed on home personally identified VM ______ 

## 2011-09-05 NOTE — Telephone Encounter (Signed)
Pt was informed of labs on her VM.  Pt returned call asking for a copy of labs and wanted to ask Dr Caryl Never "I'm still not feeling well, what else might be causing my sx?"  Labs mailed to home Cell [phone]

## 2011-10-31 ENCOUNTER — Ambulatory Visit (INDEPENDENT_AMBULATORY_CARE_PROVIDER_SITE_OTHER): Payer: 59 | Admitting: Family Medicine

## 2011-10-31 ENCOUNTER — Encounter: Payer: Self-pay | Admitting: Family Medicine

## 2011-10-31 VITALS — BP 120/72 | Temp 98.2°F | Wt 198.0 lb

## 2011-10-31 DIAGNOSIS — J209 Acute bronchitis, unspecified: Secondary | ICD-10-CM

## 2011-10-31 MED ORDER — HYDROCODONE-HOMATROPINE 5-1.5 MG/5ML PO SYRP: 5.0000 mL | ORAL_SOLUTION | Freq: Four times a day (QID) | ORAL | Status: AC | PRN

## 2011-10-31 NOTE — Progress Notes (Signed)
  Subjective:    Patient ID: Melissa Vasquez, female    DOB: [birth year], 40 y.o.   MRN: [id number]  HPI  Acute visit. Four-day history of chest congestion and nasal congestion. Increase malaise. Low-grade fever yesterday but none today. Cough especially worse at night. Not controlled with over the counter medications. Former smoker. Denies any nausea, vomiting, or diarrhea. Minimal if any sore throat. No skin rash.   Review of Systems As per history of present illness    Objective:   Physical Exam  Constitutional: She appears well-developed and well-nourished.  HENT:  Right Ear: External ear normal.  Left Ear: External ear normal.  Mouth/Throat: Oropharynx is clear and moist.  Neck: Neck supple.  Cardiovascular: Normal rate and regular rhythm.   Pulmonary/Chest: Effort normal and breath sounds normal. No respiratory distress. She has no wheezes. She has no rales.  Lymphadenopathy:    She has no cervical adenopathy.          Assessment & Plan:  Acute viral bronchitis. Hycodan 1 teaspoon every 6 hours when necessary for cough suppression. Followup when necessary

## 2011-10-31 NOTE — Patient Instructions (Signed)

## 2011-11-21 ENCOUNTER — Other Ambulatory Visit: Payer: Self-pay | Admitting: Family Medicine

## 2011-11-22 NOTE — Telephone Encounter (Signed)
Last filled 04-16-11, #30 with 5 refills.  May take TID as needed for anxiety

## 2011-11-22 NOTE — Telephone Encounter (Signed)
Refill for 6 months. 

## 2012-01-15 ENCOUNTER — Other Ambulatory Visit: Payer: Self-pay | Admitting: Family Medicine

## 2012-01-31 ENCOUNTER — Ambulatory Visit (INDEPENDENT_AMBULATORY_CARE_PROVIDER_SITE_OTHER): Payer: 59 | Admitting: Family Medicine

## 2012-01-31 ENCOUNTER — Encounter: Payer: Self-pay | Admitting: Family Medicine

## 2012-01-31 VITALS — BP 110/82 | Temp 98.2°F | Wt 200.0 lb

## 2012-01-31 DIAGNOSIS — F410 Panic disorder [episodic paroxysmal anxiety] without agoraphobia: Secondary | ICD-10-CM

## 2012-01-31 DIAGNOSIS — E785 Hyperlipidemia, unspecified: Secondary | ICD-10-CM

## 2012-01-31 LAB — LIPID PANEL: Total CHOL/HDL Ratio: 5

## 2012-01-31 LAB — LDL CHOLESTEROL, DIRECT: Direct LDL: 94.4 mg/dL

## 2012-01-31 NOTE — Patient Instructions (Signed)
Hypertriglyceridemia  Diet for High blood levels of Triglycerides Most fats in food are triglycerides. Triglycerides in your blood are stored as fat in your body. High levels of triglycerides in your blood may put you at a greater risk for heart disease and stroke.  Normal triglyceride levels are less than 150 mg/dL. Borderline high levels are 150-199 mg/dl. High levels are 200 - 499 mg/dL, and very high triglyceride levels are greater than 500 mg/dL. The decision to treat high triglycerides is generally based on the level. For people with borderline or high triglyceride levels, treatment includes weight loss and exercise. Drugs are recommended for people with very high triglyceride levels. Many people who need treatment for high triglyceride levels have metabolic syndrome. This syndrome is a collection of disorders that often include: insulin resistance, high blood pressure, blood clotting problems, high cholesterol and triglycerides. TESTING PROCEDURE FOR TRIGLYCERIDES  You should not eat 4 hours before getting your triglycerides measured. The normal range of triglycerides is between 10 and 250 milligrams per deciliter (mg/dl). Some people may have extreme levels (1000 or above), but your triglyceride level may be too high if it is above 150 mg/dl, depending on what other risk factors you have for heart disease.   People with high blood triglycerides may also have high blood cholesterol levels. If you have high blood cholesterol as well as high blood triglycerides, your risk for heart disease is probably greater than if you only had high triglycerides. High blood cholesterol is one of the main risk factors for heart disease.  CHANGING YOUR DIET  Your weight can affect your blood triglyceride level. If you are more than 20% above your ideal body weight, you may be able to lower your blood triglycerides by losing weight. Eating less and exercising regularly is the best way to combat this. Fat provides  more calories than any other food. The best way to lose weight is to eat less fat. Only 30% of your total calories should come from fat. Less than 7% of your diet should come from saturated fat. A diet low in fat and saturated fat is the same as a diet to decrease blood cholesterol. By eating a diet lower in fat, you may lose weight, lower your blood cholesterol, and lower your blood triglyceride level.  Eating a diet low in fat, especially saturated fat, may also help you lower your blood triglyceride level. Ask your dietitian to help you figure how much fat you can eat based on the number of calories your caregiver has prescribed for you.  Exercise, in addition to helping with weight loss may also help lower triglyceride levels.   Alcohol can increase blood triglycerides. You may need to stop drinking alcoholic beverages.   Too much carbohydrate in your diet may also increase your blood triglycerides. Some complex carbohydrates are necessary in your diet. These may include bread, rice, potatoes, other starchy vegetables and cereals.   Reduce "simple" carbohydrates. These may include pure sugars, candy, honey, and jelly without losing other nutrients. If you have the kind of high blood triglycerides that is affected by the amount of carbohydrates in your diet, you will need to eat less sugar and less high-sugar foods. Your caregiver can help you with this.   Adding 2-4 grams of fish oil (EPA+ DHA) may also help lower triglycerides. Speak with your caregiver before adding any supplements to your regimen.  Following the Diet  Maintain your ideal weight. Your caregivers can help you with a diet. Generally,   eating less food and getting more exercise will help you lose weight. Joining a weight control group may also help. Ask your caregivers for a good weight control group in your area.  Eat low-fat foods instead of high-fat foods. This can help you lose weight too.  These foods are lower in fat. Eat MORE  of these:   Dried beans, peas, and lentils.   Egg whites.   Low-fat cottage cheese.   Fish.   Lean cuts of meat, such as round, sirloin, rump, and flank (cut extra fat off meat you fix).   Whole grain breads, cereals and pasta.   Skim and nonfat dry milk.   Low-fat yogurt.   Poultry without the skin.   Cheese made with skim or part-skim milk, such as mozzarella, parmesan, farmers', ricotta, or pot cheese.  These are higher fat foods. Eat LESS of these:   Whole milk and foods made from whole milk, such as American, blue, cheddar, monterey jack, and swiss cheese   High-fat meats, such as luncheon meats, sausages, knockwurst, bratwurst, hot dogs, ribs, corned beef, ground pork, and regular ground beef.   Fried foods.  Limit saturated fats in your diet. Substituting unsaturated fat for saturated fat may decrease your blood triglyceride level. You will need to read package labels to know which products contain saturated fats.  These foods are high in saturated fat. Eat LESS of these:   Fried pork skins.   Whole milk.   Skin and fat from poultry.   Palm oil.   Butter.   Shortening.   Cream cheese.   Bacon.   Margarines and baked goods made from listed oils.   Vegetable shortenings.   Chitterlings.   Fat from meats.   Coconut oil.   Palm kernel oil.   Lard.   Cream.   Sour cream.   Fatback.   Coffee whiteners and non-dairy creamers made with these oils.   Cheese made from whole milk.  Use unsaturated fats (both polyunsaturated and monounsaturated) moderately. Remember, even though unsaturated fats are better than saturated fats; you still want a diet low in total fat.  These foods are high in unsaturated fat:   Canola oil.   Sunflower oil.   Mayonnaise.   Almonds.   Peanuts.   Pine nuts.   Margarines made with these oils.   Safflower oil.   Olive oil.   Avocados.   Cashews.   Peanut butter.   Sunflower seeds.   Soybean oil.     Peanut oil.   Olives.   Pecans.   Walnuts.   Pumpkin seeds.  Avoid sugar and other high-sugar foods. This will decrease carbohydrates without decreasing other nutrients. Sugar in your food goes rapidly to your blood. When there is excess sugar in your blood, your liver may use it to make more triglycerides. Sugar also contains calories without other important nutrients.  Eat LESS of these:   Sugar, brown sugar, powdered sugar, jam, jelly, preserves, honey, syrup, molasses, pies, candy, cakes, cookies, frosting, pastries, colas, soft drinks, punches, fruit drinks, and regular gelatin.   Avoid alcohol. Alcohol, even more than sugar, may increase blood triglycerides. In addition, alcohol is high in calories and low in nutrients. Ask for sparkling water, or a diet soft drink instead of an alcoholic beverage.  Suggestions for planning and preparing meals   Bake, broil, grill or roast meats instead of frying.   Remove fat from meats and skin from poultry before cooking.   Add spices,   herbs, lemon juice or vinegar to vegetables instead of salt, rich sauces or gravies.   Use a non-stick skillet without fat or use no-stick sprays.   Cool and refrigerate stews and broth. Then remove the hardened fat floating on the surface before serving.   Refrigerate meat drippings and skim off fat to make low-fat gravies.   Serve more fish.   Use less butter, margarine and other high-fat spreads on bread or vegetables.   Use skim or reconstituted non-fat dry milk for cooking.   Cook with low-fat cheeses.   Substitute low-fat yogurt or cottage cheese for all or part of the sour cream in recipes for sauces, dips or congealed salads.   Use half yogurt/half mayonnaise in salad recipes.   Substitute evaporated skim milk for cream. Evaporated skim milk or reconstituted non-fat dry milk can be whipped and substituted for whipped cream in certain recipes.   Choose fresh fruits for dessert instead of  high-fat foods such as pies or cakes. Fruits are naturally low in fat.  When Dining Out   Order low-fat appetizers such as fruit or vegetable juice, pasta with vegetables or tomato sauce.   Select clear, rather than cream soups.   Ask that dressings and gravies be served on the side. Then use less of them.   Order foods that are baked, broiled, poached, steamed, stir-fried, or roasted.   Ask for margarine instead of butter, and use only a small amount.   Drink sparkling water, unsweetened tea or coffee, or diet soft drinks instead of alcohol or other sweet beverages.  QUESTIONS AND ANSWERS ABOUT OTHER FATS IN THE BLOOD: SATURATED FAT, TRANS FAT, AND CHOLESTEROL What is trans fat? Trans fat is a type of fat that is formed when vegetable oil is hardened through a process called hydrogenation. This process helps makes foods more solid, gives them shape, and prolongs their shelf life. Trans fats are also called hydrogenated or partially hydrogenated oils.  What do saturated fat, trans fat, and cholesterol in foods have to do with heart disease? Saturated fat, trans fat, and cholesterol in the diet all raise the level of LDL "bad" cholesterol in the blood. The higher the LDL cholesterol, the greater the risk for coronary heart disease (CHD). Saturated fat and trans fat raise LDL similarly.  What foods contain saturated fat, trans fat, and cholesterol? High amounts of saturated fat are found in animal products, such as fatty cuts of meat, chicken skin, and full-fat dairy products like butter, whole milk, cream, and cheese, and in tropical vegetable oils such as palm, palm kernel, and coconut oil. Trans fat is found in some of the same foods as saturated fat, such as vegetable shortening, some margarines (especially hard or stick margarine), crackers, cookies, baked goods, fried foods, salad dressings, and other processed foods made with partially hydrogenated vegetable oils. Small amounts of trans fat  also occur naturally in some animal products, such as milk products, beef, and lamb. Foods high in cholesterol include liver, other organ meats, egg yolks, shrimp, and full-fat dairy products. How can I use the new food label to make heart-healthy food choices? Check the Nutrition Facts panel of the food label. Choose foods lower in saturated fat, trans fat, and cholesterol. For saturated fat and cholesterol, you can also use the Percent Daily Value (%DV): 5% DV or less is low, and 20% DV or more is high. (There is no %DV for trans fat.) Use the Nutrition Facts panel to choose foods low in   saturated fat and cholesterol, and if the trans fat is not listed, read the ingredients and limit products that list shortening or hydrogenated or partially hydrogenated vegetable oil, which tend to be high in trans fat. POINTS TO REMEMBER: YOU NEED A LITTLE TLC (THERAPEUTIC LIFESTYLE CHANGES)  Discuss your risk for heart disease with your caregivers, and take steps to reduce risk factors.   Change your diet. Choose foods that are low in saturated fat, trans fat, and cholesterol.   Add exercise to your daily routine if it is not already being done. Participate in physical activity of moderate intensity, like brisk walking, for at least 30 minutes on most, and preferably all days of the week. No time? Break the 30 minutes into three, 10-minute segments during the day.   Stop smoking. If you do smoke, contact your caregiver to discuss ways in which they can help you quit.   Do not use street drugs.   Maintain a normal weight.   Maintain a healthy blood pressure.   Keep up with your blood work for checking the fats in your blood as directed by your caregiver.  Document Released: 08/22/2004 Document Revised: 10/24/2011 Document Reviewed: 03/20/2009 Exit[employer] Patient Information 2012 [employer]. 

## 2012-01-31 NOTE — Progress Notes (Signed)
  Subjective:    Patient ID: Melissa Vasquez, female    DOB: August 26, 1971, 41 y.o.   MRN: [id number]  HPI  Patient here to discuss anxiety issues. She has history of panic disorder but also has some more generalized symptoms. Takes Prozac 40 mg daily and supplements with Ativan mostly at night. She does have discrete panic attacks and has also had some pervasive worries about things like having a heart attack. She has several people in her family has had heart disease including her mother. She does have high triglycerides and low HDL. Exercises occasionally. Still occasionally smokes. Denies any recent chest pains. No exertional symptoms.  History of high triglycerides. She has tried Omega 3 supplements but unable to take because of size the pill.  Past Medical History  Diagnosis Date  . HYPERLIPIDEMIA 03/16/2009  . PANIC DISORDER 03/16/2009  . HYPERTENSION 03/16/2009   Past Surgical History  Procedure Date  . Bunionectomy 1995    bilateral  . Wisdom tooth extraction 1995    reports that she quit smoking about 3 years ago. Her smoking use included Cigarettes. She has a 3 pack-year smoking history. She does not have any smokeless tobacco history on file. Her alcohol and drug histories not on file. family history includes Alcohol abuse in her other; Diabetes in her other; Heart disease in her other; Hyperlipidemia in her other; Hypertension in her other; and Mental illness in her other. No Known Allergies    Review of Systems  Constitutional: Negative for fever, appetite change and unexpected weight change.  Respiratory: Negative for cough and shortness of breath.   Cardiovascular: Negative for chest pain, palpitations and leg swelling.  Gastrointestinal: Negative for abdominal pain.  Neurological: Negative for dizziness.  Psychiatric/Behavioral: Negative for dysphoric mood and agitation. The patient is nervous/anxious.        Objective:   Physical Exam  Constitutional: She is oriented to  person, place, and time. She appears well-developed and well-nourished.  HENT:  Mouth/Throat: Oropharynx is clear and moist.  Neck: Neck supple. No thyromegaly present.  Cardiovascular: Normal rate and regular rhythm.   Pulmonary/Chest: Effort normal and breath sounds normal. No respiratory distress. She has no wheezes. She has no rales.  Musculoskeletal: She exhibits no edema.  Lymphadenopathy:    She has no cervical adenopathy.  Neurological: She is alert and oriented to person, place, and time.          Assessment & Plan:  #1 dyslipidemia. Discussed diet and exercise. Patient needs to quit smoking. Increase exercise. Reduce saturated fats. Discussed other forms of unsaturated and omega-3 sources #2 chronic anxiety. She has panic disorder and probably some generalized anxiety as well. Continue fluoxetine. Continue as needed lorazepam. We have recommended counseling to discuss other methods for dealing with anxiety

## 2012-04-14 ENCOUNTER — Other Ambulatory Visit: Payer: Self-pay | Admitting: Family Medicine

## 2012-04-14 NOTE — Telephone Encounter (Signed)
Rx refill sent to pharmacy. 

## 2012-05-20 ENCOUNTER — Other Ambulatory Visit: Payer: Self-pay | Admitting: Family Medicine

## 2012-05-20 NOTE — Telephone Encounter (Signed)
Last OV 01/2012, lorazepam last filled 11-21-11, #30 with 5 refills

## 2012-05-21 NOTE — Telephone Encounter (Signed)
Refill times 3 

## 2012-07-28 ENCOUNTER — Other Ambulatory Visit: Payer: Self-pay | Admitting: Family Medicine

## 2012-08-31 ENCOUNTER — Other Ambulatory Visit: Payer: Self-pay | Admitting: Family Medicine

## 2012-10-30 ENCOUNTER — Encounter: Payer: Self-pay | Admitting: Family Medicine

## 2012-10-30 ENCOUNTER — Ambulatory Visit (INDEPENDENT_AMBULATORY_CARE_PROVIDER_SITE_OTHER): Payer: 59 | Admitting: Family Medicine

## 2012-10-30 VITALS — BP 130/98 | Temp 98.0°F | Wt 207.0 lb

## 2012-10-30 DIAGNOSIS — Z23 Encounter for immunization: Secondary | ICD-10-CM

## 2012-10-30 DIAGNOSIS — H8110 Benign paroxysmal vertigo, unspecified ear: Secondary | ICD-10-CM

## 2012-10-30 NOTE — Progress Notes (Signed)
  Subjective:    Patient ID: Melissa Vasquez, female    DOB: Oct 08, 1971, 41 y.o.   MRN: [id number]  HPI  Patient here with vertigo. Onset this morning. Noted when rolling to the right side. She's not had any nausea or vomiting. No hearing loss. No ataxia. No focal weakness. Denies headaches. No diplopia. No history of similar problem. Denies nasal congestion. Symptoms have improved as the day has gone on   Review of Systems  Constitutional: Negative for fever and chills.  HENT: Negative for hearing loss, ear pain, congestion, tinnitus and ear discharge.   Eyes: Negative for visual disturbance.  Respiratory: Negative for cough.   Cardiovascular: Negative for chest pain.       Objective:   Physical Exam  Constitutional: She is oriented to person, place, and time. She appears well-developed and well-nourished.  HENT:       Cerumen left canal. Right is clear  Cardiovascular: Normal rate and regular rhythm.   Pulmonary/Chest: Effort normal and breath sounds normal. No respiratory distress. She has no wheezes. She has no rales.  Neurological: She is alert and oriented to person, place, and time. She has normal reflexes. No cranial nerve deficit.       No ataxia No focal weakness          Assessment & Plan:   benign positional vertigo. Reassurance. Handout given. Consider Epley maneuvers if symptoms persist

## 2012-10-30 NOTE — Patient Instructions (Addendum)
Benign Positional Vertigo  Vertigo means you feel like you or your surroundings are moving when they are not. Benign positional vertigo is the most common form of vertigo. Benign means that the cause of your condition is not serious. Benign positional vertigo is more common in older adults.  CAUSES   Benign positional vertigo is the result of an upset in the labyrinth system. This is an area in the middle ear that helps control your balance. This may be caused by a viral infection, head injury, or repetitive motion. However, often no specific cause is found.  SYMPTOMS   Symptoms of benign positional vertigo occur when you move your head or eyes in different directions. Some of the symptoms may include:  · Loss of balance and falls.  · Vomiting.  · Blurred vision.  · Dizziness.  · Nausea.  · Involuntary eye movements (nystagmus).  DIAGNOSIS   Benign positional vertigo is usually diagnosed by physical exam. If the specific cause of your benign positional vertigo is unknown, your caregiver may perform imaging tests, such as magnetic resonance imaging (MRI) or computed tomography (CT).  TREATMENT   Your caregiver may recommend movements or procedures to correct the benign positional vertigo. Medicines such as meclizine, benzodiazepines, and medicines for nausea may be used to treat your symptoms. In rare cases, if your symptoms are caused by certain conditions that affect the inner ear, you may need surgery.  HOME CARE INSTRUCTIONS   · Follow your caregiver's instructions.  · Move slowly. Do not make sudden body or head movements.  · Avoid driving.  · Avoid operating heavy machinery.  · Avoid performing any tasks that would be dangerous to you or others during a vertigo episode.  · Drink enough fluids to keep your urine clear or pale yellow.  SEEK IMMEDIATE MEDICAL CARE IF:   · You develop problems with walking, weakness, numbness, or using your arms, hands, or legs.  · You have difficulty speaking.  · You develop  severe headaches.  · Your nausea or vomiting continues or gets worse.  · You develop visual changes.  · Your family or friends notice any behavioral changes.  · Your condition gets worse.  · You have a fever.  · You develop a stiff neck or sensitivity to light.  MAKE SURE YOU:   · Understand these instructions.  · Will watch your condition.  · Will get help right away if you are not doing well or get worse.  Document Released: 08/12/2006 Document Revised: 01/27/2012 Document Reviewed: 07/25/2011  [employer]® Patient Information ©2013 [employer].

## 2012-11-05 ENCOUNTER — Other Ambulatory Visit: Payer: Self-pay | Admitting: Family Medicine

## 2012-12-07 ENCOUNTER — Other Ambulatory Visit: Payer: Self-pay | Admitting: Family Medicine

## 2012-12-07 NOTE — Telephone Encounter (Signed)
Lorazepam last filled 08-31-12, #30 with 2 refills Last OV 11-06-12

## 2012-12-07 NOTE — Telephone Encounter (Signed)
Refill for 3 months. 

## 2013-01-13 ENCOUNTER — Ambulatory Visit (INDEPENDENT_AMBULATORY_CARE_PROVIDER_SITE_OTHER): Payer: 59 | Admitting: Family Medicine

## 2013-01-13 ENCOUNTER — Encounter: Payer: Self-pay | Admitting: Family Medicine

## 2013-01-13 VITALS — BP 92/50 | Temp 98.4°F

## 2013-01-13 DIAGNOSIS — J209 Acute bronchitis, unspecified: Secondary | ICD-10-CM

## 2013-01-13 MED ORDER — HYDROCODONE-HOMATROPINE 5-1.5 MG/5ML PO SYRP: 5.0000 mL | ORAL_SOLUTION | Freq: Four times a day (QID) | ORAL | Status: AC | PRN

## 2013-01-13 NOTE — Progress Notes (Signed)
  Subjective:    Patient ID: Melissa Vasquez, female    DOB: [birth year], 42 y.o.   MRN: [id number]  HPI Onset one week ago cough Mild nasal congestion.  No fever. Cough is dry.  Mild wheezing.  Poor sleep. Occasional smoker.  Nasal congestion has cleared. Denies any sore throat. No GERD. No significant postnasal drip No relief in past with Delsym.   Review of Systems  Constitutional: Negative for fever and chills.  HENT: Negative for congestion and sore throat.   Respiratory: Positive for cough. Negative for shortness of breath.        Objective:   Physical Exam  Constitutional: She appears well-developed and well-nourished.  HENT:  Right Ear: External ear normal.  Left Ear: External ear normal.  Mouth/Throat: Oropharynx is clear and moist.  Neck: Neck supple.  Cardiovascular: Normal rate and regular rhythm.   Pulmonary/Chest: Effort normal and breath sounds normal. No respiratory distress. She has no wheezes. She has no rales.          Assessment & Plan:  Acute bronchitis. Suspect viral. Reassurance. Hycodan cough syrup for nighttime use as needed.

## 2013-01-13 NOTE — Patient Instructions (Addendum)

## 2013-02-04 ENCOUNTER — Other Ambulatory Visit: Payer: Self-pay | Admitting: Family Medicine

## 2013-03-08 ENCOUNTER — Other Ambulatory Visit: Payer: Self-pay | Admitting: Family Medicine

## 2013-03-08 NOTE — Telephone Encounter (Signed)
Refill for 3 months. 

## 2013-03-08 NOTE — Telephone Encounter (Signed)
Last filled #30 with 2 refills on 12-07-12

## 2013-03-19 ENCOUNTER — Ambulatory Visit (INDEPENDENT_AMBULATORY_CARE_PROVIDER_SITE_OTHER): Payer: 59 | Admitting: Family Medicine

## 2013-03-19 VITALS — BP 142/108 | Temp 98.5°F | Wt 204.0 lb

## 2013-03-19 DIAGNOSIS — I10 Essential (primary) hypertension: Secondary | ICD-10-CM

## 2013-03-19 MED ORDER — BISOPROLOL-HYDROCHLOROTHIAZIDE 5-6.25 MG PO TABS: 1.0000 | ORAL_TABLET | Freq: Every day | ORAL | Status: DC

## 2013-03-19 NOTE — Progress Notes (Signed)
  Subjective:    Patient ID: Melissa Vasquez, female    DOB: 07-14-1971, 42 y.o.   MRN: [id number]  HPI  Followup hypertension Patient for several years has taken bisoprolol HCTZ 2.5/6.25 mg 1 daily Last week she had some mild headaches and mild dizziness. Blood pressures at home consistently 140s to 150s systolic and 90s diastolic No recent nonsteroidal use. No alcohol use. Denies any recent chest pains. No peripheral edema. Compliant with therapy.  She previously took Accupril and did not have any side effects. This was discontinued several years ago as she became pregnant  Past Medical History  Diagnosis Date  . HYPERLIPIDEMIA 03/16/2009  . PANIC DISORDER 03/16/2009  . HYPERTENSION 03/16/2009   Past Surgical History  Procedure Laterality Date  . Bunionectomy  1995    bilateral  . Wisdom tooth extraction  1995    reports that she quit smoking about 4 years ago. Her smoking use included Cigarettes. She has a 3 pack-year smoking history. She does not have any smokeless tobacco history on file. Her alcohol and drug histories are not on file. family history includes Alcohol abuse in her other; Diabetes in her other; Heart disease in her other; Hyperlipidemia in her other; Hypertension in her other; and Mental illness in her other. No Known Allergies   Review of Systems  Constitutional: Positive for fatigue.  Eyes: Negative for visual disturbance.  Respiratory: Negative for cough, chest tightness, shortness of breath and wheezing.   Cardiovascular: Negative for chest pain, palpitations and leg swelling.  Neurological: Positive for headaches. Negative for seizures, syncope, weakness and light-headedness.       Objective:   Physical Exam  Constitutional: She appears well-developed and well-nourished.  Neck: Neck supple. No thyromegaly present.  Cardiovascular: Normal rate and regular rhythm.   Pulmonary/Chest: Effort normal and breath sounds normal. No respiratory distress. She has  no wheezes. She has no rales.  Musculoskeletal: She exhibits no edema.          Assessment & Plan:  Hypertension. Poorly controlled. Increase bisoprolol  To 5/6.25 mg 1 daily. Reassess blood pressure one month

## 2013-03-19 NOTE — Patient Instructions (Signed)
Increase Ziac to 5/6.25 mg once daily.

## 2013-04-16 ENCOUNTER — Other Ambulatory Visit: Payer: Self-pay | Admitting: Family Medicine

## 2013-06-19 ENCOUNTER — Other Ambulatory Visit: Payer: Self-pay | Admitting: Family Medicine

## 2013-06-21 NOTE — Telephone Encounter (Signed)
Refill for 6 months. 

## 2013-07-26 ENCOUNTER — Other Ambulatory Visit: Payer: Self-pay | Admitting: Obstetrics and Gynecology

## 2013-11-09 ENCOUNTER — Other Ambulatory Visit: Payer: Self-pay | Admitting: Family Medicine

## 2014-02-08 ENCOUNTER — Telehealth: Payer: Self-pay | Admitting: Family Medicine

## 2014-02-08 MED ORDER — FLUOXETINE HCL 40 MG PO CAPS: ORAL_CAPSULE | ORAL | Status: DC

## 2014-02-08 MED ORDER — LORAZEPAM 1 MG PO TABS: ORAL_TABLET | ORAL | Status: DC

## 2014-02-08 NOTE — Telephone Encounter (Signed)
[id number] - [place], [place] - [address] is requesting re-fills on the following:  LORazepam (ATIVAN) 1 MG tablet FLUoxetine (PROZAC) 40 MG capsule

## 2014-02-08 NOTE — Telephone Encounter (Signed)
Refill once.  Needs office follow up. 

## 2014-02-08 NOTE — Telephone Encounter (Signed)
Ativan  Last visit 03/19/13 Last refill 06/19/13 #30 5 refills

## 2014-02-08 NOTE — Telephone Encounter (Signed)
Rx called into the pharmacy. 

## 2014-03-09 ENCOUNTER — Other Ambulatory Visit: Payer: Self-pay | Admitting: Family Medicine

## 2014-04-06 ENCOUNTER — Telehealth: Payer: Self-pay | Admitting: Family Medicine

## 2014-04-06 MED ORDER — BISOPROLOL-HYDROCHLOROTHIAZIDE 2.5-6.25 MG PO TABS: ORAL_TABLET | ORAL | Status: DC

## 2014-04-06 NOTE — Telephone Encounter (Signed)
Rx sent to pharmacy   

## 2014-04-06 NOTE — Telephone Encounter (Signed)
[id number] - [place], [place] - [address] is requesting re-fill on bisoprolol-hydrochlorothiazide (ZIAC) 2.5-6.25 MG per tablet

## 2014-04-28 ENCOUNTER — Other Ambulatory Visit: Payer: Self-pay

## 2014-04-28 MED ORDER — BISOPROLOL-HYDROCHLOROTHIAZIDE 2.5-6.25 MG PO TABS: ORAL_TABLET | ORAL | Status: DC

## 2014-05-13 ENCOUNTER — Ambulatory Visit (INDEPENDENT_AMBULATORY_CARE_PROVIDER_SITE_OTHER): Payer: 59 | Admitting: Family Medicine

## 2014-05-13 ENCOUNTER — Encounter: Payer: Self-pay | Admitting: Family Medicine

## 2014-05-13 VITALS — BP 116/80 | HR 75 | Temp 98.3°F | Ht 62.25 in | Wt 194.0 lb

## 2014-05-13 DIAGNOSIS — J029 Acute pharyngitis, unspecified: Secondary | ICD-10-CM

## 2014-05-13 DIAGNOSIS — J069 Acute upper respiratory infection, unspecified: Secondary | ICD-10-CM

## 2014-05-13 LAB — POCT RAPID STREP A (OFFICE): Rapid Strep A Screen: NEGATIVE

## 2014-05-13 NOTE — Progress Notes (Signed)
No chief complaint on file.   HPI:  -started: 3 days ago -symptoms:nasal congestion, very sore throat, cough, sinus pressure -denies:fever, SOB, NVD, tooth pain -has tried: nothing -sick contacts/travel/risks: denies flu exposure, tick exposure or or Ebola risks - worried about strep though  ROS: See pertinent positives and negatives per HPI.  Past Medical History  Diagnosis Date  . HYPERLIPIDEMIA 03/16/2009  . PANIC DISORDER 03/16/2009  . HYPERTENSION 03/16/2009    Past Surgical History  Procedure Laterality Date  . Bunionectomy  1995    bilateral  . Wisdom tooth extraction  1995    Family History  Problem Relation Age of Onset  . Alcohol abuse Other   . Hyperlipidemia Other   . Hypertension Other   . Mental illness Other   . Diabetes Other   . Heart disease Other     History   Social History  . Marital Status: Married    Spouse Name: N/A    Number of Children: N/A  . Years of Education: N/A   Social History Main Topics  . Smoking status: Former Smoker -- 0.20 packs/day for 15 years    Types: Cigarettes    Quit date: 01/02/2009  . Smokeless tobacco: None  . Alcohol Use: None  . Drug Use: None  . Sexual Activity: None   Other Topics Concern  . None   Social History Narrative  . None    Current outpatient prescriptions:bisoprolol-hydrochlorothiazide (ZIAC) 5-6.25 MG per tablet, Take 1 tablet by mouth daily., Disp: , Rfl: ;  Clobetasol Propionate (CLOBEX SPRAY) 0.05 % external spray, Apply topically 2 (two) times daily. Spray to ankles and ears as needed for psorasis , Disp: , Rfl: ;  FLUoxetine (PROZAC) 40 MG capsule, TAKE ONE CAPSULE BY MOUTH EVERY DAY, Disp: 30 capsule, Rfl: 0 LORazepam (ATIVAN) 1 MG tablet, TAKE ONE TABLET BY MOUTH EVERY 8 HOURS AS NEEDED FOR ANXIETY, Disp: 30 tablet, Rfl: 0  EXAM:  Filed Vitals:   05/13/14 1126  BP: 116/80  Pulse: 75  Temp: 98.3 F (36.8 C)    Body mass index is 35.21 kg/(m^2).  GENERAL: vitals reviewed and  listed above, alert, oriented, appears well hydrated and in no acute distress  HEENT: atraumatic, conjunttiva clear, no obvious abnormalities on inspection of external nose and ears, normal appearance of ear canals and TMs, clear nasal congestion, mild post oropharyngeal erythema with PND, 1+ tonsillar edema, no exudate, no sinus TTP  NECK: no obvious masses on inspection  LUNGS: clear to auscultation bilaterally, no wheezes, rales or rhonchi, good air movement  CV: HRRR, no peripheral edema  MS: moves all extremities without noticeable abnormality  PSYCH: pleasant and cooperative, no obvious depression or anxiety  ASSESSMENT AND PLAN:  Discussed the following assessment and plan:  Sore throat  Upper respiratory infection  -given HPI and exam findings today, a serious infection or illness is unlikely. We discussed potential etiologies, with VURI being most likely, and advised supportive care and monitoring. We discussed treatment side effects, likely course, antibiotic misuse, transmission, and signs of developing a serious illness. -rapid and strep culture given her concern and treat if positive. -of course, we advised to return or notify a doctor immediately if symptoms worsen or persist or new concerns arise.    Patient Instructions  INSTRUCTIONS FOR UPPER RESPIRATORY INFECTION:  -plenty of rest and fluids  -nasal saline wash 2-3 times daily (use prepackaged nasal saline or bottled/distilled water if making your own)   -can use AFRIN spray for  drainage and nasal congestion - but do NOT use longer then 3-4 days  -can use tylenol or ibuprofen as directed for aches and sorethroat  -in the winter time, using a humidifier at night is helpful (please follow cleaning instructions)  -if you are taking a cough medication - use only as directed, may also try a teaspoon of honey to coat the throat and throat lozenges  -for sore throat, salt water gargles can help  -follow up if  you have fevers, facial pain, tooth pain, difficulty breathing or are worsening or not getting better in 5-7 days      KIM, Jarrett Soho R.

## 2014-05-13 NOTE — Patient Instructions (Signed)
INSTRUCTIONS FOR UPPER RESPIRATORY INFECTION:  -plenty of rest and fluids  -nasal saline wash 2-3 times daily (use prepackaged nasal saline or bottled/distilled water if making your own)   -can use AFRIN spray for drainage and nasal congestion - but do NOT use longer then 3-4 days  -can use tylenol or ibuprofen as directed for aches and sorethroat  -in the winter time, using a humidifier at night is helpful (please follow cleaning instructions)  -if you are taking a cough medication - use only as directed, may also try a teaspoon of honey to coat the throat and throat lozenges  -for sore throat, salt water gargles can help  -follow up if you have fevers, facial pain, tooth pain, difficulty breathing or are worsening or not getting better in 5-7 days

## 2014-05-13 NOTE — Progress Notes (Signed)
Pre visit review using our clinic review tool, if applicable. No additional management support is needed unless otherwise documented below in the visit note. 

## 2014-05-15 LAB — CULTURE, GROUP A STREP: ORGANISM ID, BACTERIA: NORMAL

## 2014-05-17 ENCOUNTER — Telehealth: Payer: Self-pay | Admitting: Family Medicine

## 2014-05-17 MED ORDER — BISOPROLOL-HYDROCHLOROTHIAZIDE 5-6.25 MG PO TABS: 1.0000 | ORAL_TABLET | Freq: Every day | ORAL | Status: DC

## 2014-05-17 NOTE — Telephone Encounter (Signed)
Patient's rapid strep and culture both were negative.

## 2014-05-17 NOTE — Telephone Encounter (Signed)
RX sent to pharmacy  

## 2014-05-17 NOTE — Telephone Encounter (Signed)
Pt saw Dr. Maudie Mercury on 05/13/14

## 2014-05-17 NOTE — Telephone Encounter (Signed)
Pt spoke w/ someone yesterday who asked if she was feeling better.  Pt states she is not and was told something may be called in for her. pls advies [facility]

## 2014-05-17 NOTE — Telephone Encounter (Signed)
Pt is calling back and thinks she has sinus infection

## 2014-05-17 NOTE — Telephone Encounter (Signed)
[id number] - [place], [place] - [address] is requesting re-fill on bisoprolol-hydrochlorothiazide (ZIAC) 5-6.25 MG per tablet

## 2014-05-18 ENCOUNTER — Ambulatory Visit (INDEPENDENT_AMBULATORY_CARE_PROVIDER_SITE_OTHER): Payer: 59 | Admitting: Family Medicine

## 2014-05-18 ENCOUNTER — Encounter: Payer: Self-pay | Admitting: Family Medicine

## 2014-05-18 VITALS — BP 124/84 | HR 81 | Temp 98.4°F | Wt 194.0 lb

## 2014-05-18 DIAGNOSIS — J018 Other acute sinusitis: Secondary | ICD-10-CM

## 2014-05-18 MED ORDER — AMOXICILLIN-POT CLAVULANATE 875-125 MG PO TABS: 1.0000 | ORAL_TABLET | Freq: Two times a day (BID) | ORAL | Status: DC

## 2014-05-18 NOTE — Progress Notes (Signed)
Pre visit review using our clinic review tool, if applicable. No additional management support is needed unless otherwise documented below in the visit note. 

## 2014-05-18 NOTE — Progress Notes (Signed)
   Subjective:    Patient ID: Melissa Vasquez, female    DOB: 04/09/1971, 43 y.o.   MRN: [id number]  Cough Associated symptoms include headaches. Pertinent negatives include no chills or fever.   Recent URI symptoms. She feels no better and possibly worse. She's describing puslike discharge from her nose and frequent headaches and upper teeth pain. Symptoms are somewhat bilateral. No relief with over-the-counter medications. Chills off and on but no documented fever. Recent rapid strep and culture negative. She has productive cough  Past Medical History  Diagnosis Date  . HYPERLIPIDEMIA 03/16/2009  . PANIC DISORDER 03/16/2009  . HYPERTENSION 03/16/2009   Past Surgical History  Procedure Laterality Date  . Bunionectomy  1995    bilateral  . Wisdom tooth extraction  1995    reports that she quit smoking about 5 years ago. Her smoking use included Cigarettes. She has a 3 pack-year smoking history. She does not have any smokeless tobacco history on file. Her alcohol and drug histories are not on file. family history includes Alcohol abuse in her other; Diabetes in her other; Heart disease in her other; Hyperlipidemia in her other; Hypertension in her other; Mental illness in her other. No Known Allergies    Review of Systems  Constitutional: Positive for fatigue. Negative for fever and chills.  HENT: Positive for congestion and sinus pressure.   Respiratory: Positive for cough.   Neurological: Positive for headaches.       Objective:   Physical Exam  Constitutional: She appears well-developed and well-nourished.  HENT:  Right Ear: External ear normal.  Left Ear: External ear normal.  Mouth/Throat: Oropharynx is clear and moist.  Neck: Neck supple. No thyromegaly present.  Cardiovascular: Normal rate and regular rhythm.   Pulmonary/Chest: Effort normal and breath sounds normal. No respiratory distress. She has no wheezes. She has no rales.  Lymphadenopathy:    She has no cervical  adenopathy.          Assessment & Plan:  Acute sinusitis. Augmentin 875 mg twice daily for 10 days

## 2014-05-18 NOTE — Patient Instructions (Signed)
Sinusitis Sinusitis is redness, soreness, and swelling (inflammation) of the paranasal sinuses. Paranasal sinuses are air pockets within the bones of your face (beneath the eyes, the middle of the forehead, or above the eyes). In healthy paranasal sinuses, mucus is able to drain out, and air is able to circulate through them by way of your nose. However, when your paranasal sinuses are inflamed, mucus and air can become trapped. This can allow bacteria and other germs to grow and cause infection. Sinusitis can develop quickly and last only a short time (acute) or continue over a long period (chronic). Sinusitis that lasts for more than 12 weeks is considered chronic.  CAUSES  Causes of sinusitis include:  Allergies.  Structural abnormalities, such as displacement of the cartilage that separates your nostrils (deviated septum), which can decrease the air flow through your nose and sinuses and affect sinus drainage.  Functional abnormalities, such as when the small hairs (cilia) that line your sinuses and help remove mucus do not work properly or are not present. SYMPTOMS  Symptoms of acute and chronic sinusitis are the same. The primary symptoms are pain and pressure around the affected sinuses. Other symptoms include:  Upper toothache.  Earache.  Headache.  Bad breath.  Decreased sense of smell and taste.  A cough, which worsens when you are lying flat.  Fatigue.  Fever.  Thick drainage from your nose, which often is green and may contain pus (purulent).  Swelling and warmth over the affected sinuses. DIAGNOSIS  Your caregiver will perform a physical exam. During the exam, your caregiver may:  Look in your nose for signs of abnormal growths in your nostrils (nasal polyps).  Tap over the affected sinus to check for signs of infection.  View the inside of your sinuses (endoscopy) with a special imaging device with a light attached (endoscope), which is inserted into your  sinuses. If your caregiver suspects that you have chronic sinusitis, one or more of the following tests may be recommended:  Allergy tests.  Nasal culture--A sample of mucus is taken from your nose and sent to a lab and screened for bacteria.  Nasal cytology--A sample of mucus is taken from your nose and examined by your caregiver to determine if your sinusitis is related to an allergy. TREATMENT  Most cases of acute sinusitis are related to a viral infection and will resolve on their own within 10 days. Sometimes medicines are prescribed to help relieve symptoms (pain medicine, decongestants, nasal steroid sprays, or saline sprays).  However, for sinusitis related to a bacterial infection, your caregiver will prescribe antibiotic medicines. These are medicines that will help kill the bacteria causing the infection.  Rarely, sinusitis is caused by a fungal infection. In theses cases, your caregiver will prescribe antifungal medicine. For some cases of chronic sinusitis, surgery is needed. Generally, these are cases in which sinusitis recurs more than 3 times per year, despite other treatments. HOME CARE INSTRUCTIONS   Drink plenty of water. Water helps thin the mucus so your sinuses can drain more easily.  Use a humidifier.  Inhale steam 3 to 4 times a day (for example, sit in the bathroom with the shower running).  Apply a warm, moist washcloth to your face 3 to 4 times a day, or as directed by your caregiver.  Use saline nasal sprays to help moisten and clean your sinuses.  Take over-the-counter or prescription medicines for pain, discomfort, or fever only as directed by your caregiver. SEEK IMMEDIATE MEDICAL CARE IF:    You have increasing pain or severe headaches.  You have nausea, vomiting, or drowsiness.  You have swelling around your face.  You have vision problems.  You have a stiff neck.  You have difficulty breathing. MAKE SURE YOU:   Understand these  instructions.  Will watch your condition.  Will get help right away if you are not doing well or get worse. Document Released: 11/04/2005 Document Revised: 01/27/2012 Document Reviewed: 11/19/2011 Exit[employer] Patient Information 2015 [employer]. This information is not intended to replace advice given to you by your health care provider. Make sure you discuss any questions you have with your health care provider.  

## 2014-05-19 ENCOUNTER — Ambulatory Visit: Payer: 59 | Admitting: Family Medicine

## 2014-05-25 ENCOUNTER — Telehealth: Payer: Self-pay | Admitting: Family Medicine

## 2014-05-25 MED ORDER — BENZONATATE 200 MG PO CAPS: 200.0000 mg | ORAL_CAPSULE | Freq: Three times a day (TID) | ORAL | Status: DC | PRN

## 2014-05-25 NOTE — Telephone Encounter (Signed)
Pt states she was seen last Wednesday for sinus infection, pt states she is still taking her antibiotic however she states she is still coughing/congestion and wanted to know if she can get something called in for it. [facility] to [facility].

## 2014-05-25 NOTE — Telephone Encounter (Signed)
Rx sent to pharmacy   

## 2014-05-25 NOTE — Telephone Encounter (Signed)
Tessalon Perles 200 mg every 8 hours prn cough dispense #30 with no refills

## 2014-05-30 ENCOUNTER — Ambulatory Visit (INDEPENDENT_AMBULATORY_CARE_PROVIDER_SITE_OTHER): Payer: 59 | Admitting: Family Medicine

## 2014-05-30 ENCOUNTER — Encounter: Payer: Self-pay | Admitting: Family Medicine

## 2014-05-30 VITALS — BP 118/76 | HR 67 | Temp 97.8°F | Wt 192.0 lb

## 2014-05-30 DIAGNOSIS — B9789 Other viral agents as the cause of diseases classified elsewhere: Principal | ICD-10-CM

## 2014-05-30 DIAGNOSIS — J069 Acute upper respiratory infection, unspecified: Secondary | ICD-10-CM

## 2014-05-30 MED ORDER — PREDNISONE 10 MG PO TABS: ORAL_TABLET | ORAL | Status: DC

## 2014-05-30 NOTE — Progress Notes (Signed)
Pre visit review using our clinic review tool, if applicable. No additional management support is needed unless otherwise documented below in the visit note. 

## 2014-05-30 NOTE — Progress Notes (Signed)
   Subjective:    Patient ID: Melissa Vasquez, female    DOB: [birth year], 43 y.o.   MRN: [id number]  Cough Associated symptoms include wheezing. Pertinent negatives include no chest pain, chills, fever or shortness of breath.   Acute visit. Patient has had recent upper respiratory infection with cough and had some persistent coughing. Her nasal mucus is somewhat diminished but still some slight yellow tinge. She just finished course of Augmentin. No fever. She feels she is wheezing some off and on. No fever.  Past Medical History  Diagnosis Date  . HYPERLIPIDEMIA 03/16/2009  . PANIC DISORDER 03/16/2009  . HYPERTENSION 03/16/2009   Past Surgical History  Procedure Laterality Date  . Bunionectomy  1995    bilateral  . Wisdom tooth extraction  1995    reports that she quit smoking about 5 years ago. Her smoking use included Cigarettes. She has a 3 pack-year smoking history. She does not have any smokeless tobacco history on file. Her alcohol and drug histories are not on file. family history includes Alcohol abuse in her other; Diabetes in her other; Heart disease in her other; Hyperlipidemia in her other; Hypertension in her other; Mental illness in her other. No Known Allergies   Review of Systems  Constitutional: Negative for fever and chills.  HENT: Positive for congestion.   Respiratory: Positive for cough and wheezing. Negative for shortness of breath.   Cardiovascular: Negative for chest pain.       Objective:   Physical Exam  Constitutional: She appears well-developed and well-nourished.  HENT:  Right Ear: External ear normal.  Left Ear: External ear normal.  Pulmonary/Chest: Effort normal and breath sounds normal. No respiratory distress. She has no wheezes. She has no rales.          Assessment & Plan:  Upper respiratory infection with persistent cough. Suspect recent viral versus acute bacterial sinusitis. Prednisone taper. Followup promopty for fever or if symptoms  persist

## 2014-05-30 NOTE — Patient Instructions (Signed)
Follow up for any fever or persistent cough. 

## 2014-06-05 ENCOUNTER — Other Ambulatory Visit: Payer: Self-pay | Admitting: Family Medicine

## 2014-06-22 ENCOUNTER — Other Ambulatory Visit: Payer: Self-pay | Admitting: Family Medicine

## 2014-06-24 ENCOUNTER — Telehealth: Payer: Self-pay | Admitting: Family Medicine

## 2014-06-24 MED ORDER — LORAZEPAM 1 MG PO TABS: ORAL_TABLET | ORAL | Status: DC

## 2014-06-24 NOTE — Telephone Encounter (Signed)
Last visit 05/30/14 Last refill 02/08/14 #30 0 refill

## 2014-06-24 NOTE — Telephone Encounter (Signed)
Refill OK

## 2014-06-24 NOTE — Telephone Encounter (Signed)
RX called in .

## 2014-06-24 NOTE — Telephone Encounter (Signed)
[id number] - [place], [place] - [address] is requesting re-fill on LORazepam (ATIVAN) 1 MG tablet

## 2014-07-26 ENCOUNTER — Telehealth: Payer: Self-pay

## 2014-07-26 NOTE — Telephone Encounter (Signed)
Lorazepam  Last visit 05/30/14 Last refill 06/24/14 #30 0 refill   [facility]

## 2014-07-27 MED ORDER — LORAZEPAM 1 MG PO TABS: ORAL_TABLET | ORAL | Status: DC

## 2014-07-27 NOTE — Telephone Encounter (Signed)
Refill for 3 months. 

## 2014-07-27 NOTE — Telephone Encounter (Signed)
Rx called in 

## 2014-09-08 ENCOUNTER — Other Ambulatory Visit: Payer: Self-pay | Admitting: Obstetrics and Gynecology

## 2014-09-09 LAB — CYTOLOGY - PAP

## 2014-10-11 ENCOUNTER — Other Ambulatory Visit: Payer: Self-pay | Admitting: Family Medicine

## 2014-10-11 NOTE — Telephone Encounter (Signed)
Last visit 05/30/14 Last refill 07/27/14 #30 2 refills

## 2014-10-12 NOTE — Telephone Encounter (Signed)
Refill for 3 months.  Try not to exceed one tablet daily

## 2014-11-18 ENCOUNTER — Other Ambulatory Visit: Payer: Self-pay | Admitting: Family Medicine

## 2014-12-12 ENCOUNTER — Other Ambulatory Visit: Payer: Self-pay | Admitting: Family Medicine

## 2014-12-22 ENCOUNTER — Other Ambulatory Visit: Payer: Self-pay | Admitting: Physician Assistant

## 2014-12-30 ENCOUNTER — Other Ambulatory Visit: Payer: Self-pay | Admitting: Family Medicine

## 2015-01-02 NOTE — Telephone Encounter (Signed)
Last visit 05/30/14 Last refill 10/12/14 #30 2 refill

## 2015-01-02 NOTE — Telephone Encounter (Signed)
Refill for 3 months. 

## 2015-03-23 ENCOUNTER — Ambulatory Visit (INDEPENDENT_AMBULATORY_CARE_PROVIDER_SITE_OTHER): Payer: 59 | Admitting: Family Medicine

## 2015-03-23 ENCOUNTER — Encounter: Payer: Self-pay | Admitting: Family Medicine

## 2015-03-23 VITALS — BP 122/80 | HR 72 | Temp 98.2°F | Wt 206.0 lb

## 2015-03-23 DIAGNOSIS — F418 Other specified anxiety disorders: Secondary | ICD-10-CM

## 2015-03-23 DIAGNOSIS — Z8659 Personal history of other mental and behavioral disorders: Secondary | ICD-10-CM | POA: Diagnosis not present

## 2015-03-23 DIAGNOSIS — R0981 Nasal congestion: Secondary | ICD-10-CM

## 2015-03-23 NOTE — Progress Notes (Signed)
   Subjective:    Patient ID: Melissa Vasquez, female    DOB: 02-16-1971, 44 y.o.   MRN: [id number]  HPI Patient seen with several issues  Anxiety regarding upcoming flight. She's never flown before. She has long history of anxiety. She takes Ativan and Prozac. She has hx of panic disorder fairly well controlled on Prozac.  Recent headaches. Mostly bifrontal. Frequent nasal congestion. Frequent sneezing. No purulence secretions. No fevers or chills.  Long history of depression. Recently had some increased sadness. She thinks this because her daughter and son-in-law have been away and she is very excited about them returning to the [facility]. Her symptoms are very intermittent.  Occasional sleep disturbance.  No suicidal ideation.  Past Medical History  Diagnosis Date  . HYPERLIPIDEMIA 03/16/2009  . PANIC DISORDER 03/16/2009  . HYPERTENSION 03/16/2009   Past Surgical History  Procedure Laterality Date  . Bunionectomy  1995    bilateral  . Wisdom tooth extraction  1995    reports that she quit smoking about 6 years ago. Her smoking use included Cigarettes. She has a 3 pack-year smoking history. She does not have any smokeless tobacco history on file. Her alcohol and drug histories are not on file. family history includes Alcohol abuse in her other; Diabetes in her other; Heart disease in her other; Hyperlipidemia in her other; Hypertension in her other; Mental illness in her other. No Known Allergies    Review of Systems  Constitutional: Negative for appetite change and unexpected weight change.  Psychiatric/Behavioral: Positive for dysphoric mood. Negative for suicidal ideas and agitation.       Objective:   Physical Exam  Constitutional: She is oriented to person, place, and time. She appears well-developed and well-nourished.  Cardiovascular: Normal rate and regular rhythm.   Pulmonary/Chest: Effort normal and breath sounds normal. No respiratory distress. She has no wheezes. She  has no rales.  Neurological: She is alert and oriented to person, place, and time. No cranial nerve deficit.          Assessment & Plan:  #1 situational anxiety. We discussed strategies for managing anxiety regarding her flight. She has Ativan to take as needed. We discussed consideration for cognitive behavioral therapy but this point she is not interested #2 history of recurrent depression. Currently on Prozac. We discussed possible additional medications at this point she wishes to wait #3 sinus congestion. Suspect allergic. Try over-the-counter Allegra.

## 2015-03-23 NOTE — Progress Notes (Signed)
Pre visit review using our clinic review tool, if applicable. No additional management support is needed unless otherwise documented below in the visit note. 

## 2015-03-23 NOTE — Patient Instructions (Signed)
Try over the counter antihistamine such as Allegra.

## 2015-03-24 DIAGNOSIS — F4321 Adjustment disorder with depressed mood: Secondary | ICD-10-CM | POA: Insufficient documentation

## 2015-03-24 DIAGNOSIS — Z8659 Personal history of other mental and behavioral disorders: Secondary | ICD-10-CM | POA: Insufficient documentation

## 2015-03-27 ENCOUNTER — Encounter: Payer: Self-pay | Admitting: Family Medicine

## 2015-03-27 ENCOUNTER — Telehealth: Payer: Self-pay | Admitting: Family Medicine

## 2015-03-27 MED ORDER — OSELTAMIVIR PHOSPHATE 75 MG PO CAPS: 75.0000 mg | ORAL_CAPSULE | Freq: Every day | ORAL | Status: DC

## 2015-03-27 NOTE — Telephone Encounter (Signed)
IF she has no symptoms and wants to take prophylactically, Tamiflu 75 mg po once daily for 10 days.  Make sure she is not having active flu symptoms.

## 2015-03-27 NOTE — Telephone Encounter (Signed)
Pt husband dx w/ the flu and was given tamiflu.  They are going on vacation next week and she would like to know if dr burchette will call in tamiflu for her as well. [facility], [place]

## 2015-03-27 NOTE — Telephone Encounter (Signed)
No symptoms, Rx sent to pharmacy. Pt is informed.

## 2015-04-13 ENCOUNTER — Other Ambulatory Visit: Payer: Self-pay | Admitting: Family Medicine

## 2015-04-13 NOTE — Telephone Encounter (Signed)
Refill for 3 months. 

## 2015-04-13 NOTE — Telephone Encounter (Signed)
Last visit 03/23/15 Last refill 01/02/15 #30 2 refill

## 2015-05-03 ENCOUNTER — Other Ambulatory Visit: Payer: Self-pay | Admitting: Family Medicine

## 2015-05-12 ENCOUNTER — Ambulatory Visit (INDEPENDENT_AMBULATORY_CARE_PROVIDER_SITE_OTHER): Payer: 59 | Admitting: Family Medicine

## 2015-05-12 ENCOUNTER — Encounter: Payer: Self-pay | Admitting: Family Medicine

## 2015-05-12 VITALS — BP 128/80 | HR 84 | Temp 98.3°F | Wt 208.0 lb

## 2015-05-12 DIAGNOSIS — F410 Panic disorder [episodic paroxysmal anxiety] without agoraphobia: Secondary | ICD-10-CM | POA: Diagnosis not present

## 2015-05-12 DIAGNOSIS — I10 Essential (primary) hypertension: Secondary | ICD-10-CM | POA: Diagnosis not present

## 2015-05-12 DIAGNOSIS — G44209 Tension-type headache, unspecified, not intractable: Secondary | ICD-10-CM

## 2015-05-12 MED ORDER — TIZANIDINE HCL 4 MG PO CAPS: 4.0000 mg | ORAL_CAPSULE | Freq: Three times a day (TID) | ORAL | Status: DC | PRN

## 2015-05-12 NOTE — Progress Notes (Signed)
   Subjective:    Patient ID: Melissa Vasquez, female    DOB: 1971-07-25, 44 y.o.   MRN: [id number]  HPI Patient seen with chief complaint of headache. She's had cough for about a week with some postnasal drainage. She's had some headache which is diffuse and starts occipital area bilateral and radiates bifrontal. She was concerned because she has occasional cough related headache. She has not had any consistent exertional headache. No neck stiffness. No fevers or chills. Denies any focal facial pain. Does have some postnasal drip. His tried and a histamines without improvement. Ibuprofen has not helped her headache. She has noticed some muscle soreness in her upper back and neck region. Poor sleep quality. Intermittent ringing in both ears but no acute hearing changes.  She has hypertension stable on Ziac. No recent lightheadedness. Compliant with therapy. Panic attacks and has been for several years on fluoxetine. Generally fairly well controlled  Past Medical History  Diagnosis Date  . HYPERLIPIDEMIA 03/16/2009  . PANIC DISORDER 03/16/2009  . HYPERTENSION 03/16/2009   Past Surgical History  Procedure Laterality Date  . Bunionectomy  1995    bilateral  . Wisdom tooth extraction  1995    reports that she quit smoking about 6 years ago. Her smoking use included Cigarettes. She has a 3 pack-year smoking history. She does not have any smokeless tobacco history on file. Her alcohol and drug histories are not on file. family history includes Alcohol abuse in her other; Diabetes in her other; Heart disease in her other; Hyperlipidemia in her other; Hypertension in her other; Mental illness in her other. No Known Allergies    Review of Systems  Constitutional: Negative for fever and chills.  HENT: Positive for congestion.   Respiratory: Positive for cough.   Cardiovascular: Negative for chest pain.  Neurological: Positive for headaches. Negative for seizures.  Hematological: Negative for  adenopathy.  Psychiatric/Behavioral: Negative for confusion.       Objective:   Physical Exam  Constitutional: She is oriented to person, place, and time. She appears well-developed and well-nourished.  HENT:  Right Ear: External ear normal.  Left Ear: External ear normal.  Mouth/Throat: Oropharynx is clear and moist.  Eyes: Pupils are equal, round, and reactive to light.  Fundi benign  Neck: Neck supple.  Cardiovascular: Normal rate and regular rhythm.   Pulmonary/Chest: Effort normal and breath sounds normal. No respiratory distress. She has no wheezes. She has no rales.  Musculoskeletal:  Palpable muscle tension and tenderness trapezius muscles bilaterally  Lymphadenopathy:    She has no cervical adenopathy.  Neurological: She is alert and oriented to person, place, and time. No cranial nerve deficit.  Skin: No rash noted.          Assessment & Plan:  Headache. Suspect tension-type headache. Recommend moist heat upper back and avoid daily use of analgesics. Short-term use only of Zanaflex 4 mg daily at bedtime  Hypertension. Stable and at goal. Continue Ziac  History of chronic anxiety. She has panic attacks which are generally fairly well controlled with fluoxetine. She supplements with lorazepam as needed

## 2015-05-12 NOTE — Patient Instructions (Signed)

## 2015-05-12 NOTE — Progress Notes (Signed)
Pre visit review using our clinic review tool, if applicable. No additional management support is needed unless otherwise documented below in the visit note. 

## 2015-05-29 ENCOUNTER — Encounter: Payer: Self-pay | Admitting: Family Medicine

## 2015-05-29 ENCOUNTER — Other Ambulatory Visit: Payer: Self-pay | Admitting: Family Medicine

## 2015-05-29 ENCOUNTER — Ambulatory Visit (INDEPENDENT_AMBULATORY_CARE_PROVIDER_SITE_OTHER): Payer: 59 | Admitting: Family Medicine

## 2015-05-29 VITALS — BP 126/82 | HR 81 | Temp 98.6°F | Wt 203.0 lb

## 2015-05-29 DIAGNOSIS — G44229 Chronic tension-type headache, not intractable: Secondary | ICD-10-CM

## 2015-05-29 MED ORDER — NORTRIPTYLINE HCL 10 MG PO CAPS: 10.0000 mg | ORAL_CAPSULE | Freq: Every day | ORAL | Status: DC

## 2015-05-29 NOTE — Progress Notes (Signed)
   Subjective:    Patient ID: Melissa Vasquez, female    DOB: 21-Jun-1971, 44 y.o.   MRN: [id number]  HPI Here for follow-up regarding her headaches. Refer to prior note. We suspect chronic tension-type headaches. She started Zanaflex and saw some mild improvement but not resolution. She is not having headaches every day but still several days per month. These are usually bifrontal and dull and relatively mild. She's not had any fever, chills, sinusitis type symptoms, recent head injury, seizure, confusion, visual change, or any appetite or weight changes. No clear exacerbating factors. Struggles to get good sleep at times.  Past Medical History  Diagnosis Date  . HYPERLIPIDEMIA 03/16/2009  . PANIC DISORDER 03/16/2009  . HYPERTENSION 03/16/2009   Past Surgical History  Procedure Laterality Date  . Bunionectomy  1995    bilateral  . Wisdom tooth extraction  1995    reports that she quit smoking about 6 years ago. Her smoking use included Cigarettes. She has a 3 pack-year smoking history. She does not have any smokeless tobacco history on file. Her alcohol and drug histories are not on file. family history includes Alcohol abuse in her other; Diabetes in her other; Heart disease in her other; Hyperlipidemia in her other; Hypertension in her other; Mental illness in her other. No Known Allergies    Review of Systems  Constitutional: Negative for fever, chills and appetite change.  Respiratory: Negative for cough and shortness of breath.   Cardiovascular: Negative for chest pain.  Neurological: Positive for headaches. Negative for seizures, syncope and weakness.  Hematological: Negative for adenopathy.  Psychiatric/Behavioral: Negative for confusion.       Objective:   Physical Exam  Constitutional: She is oriented to person, place, and time. She appears well-developed and well-nourished. No distress.  Cardiovascular: Normal rate and regular rhythm.   Pulmonary/Chest: Effort normal and  breath sounds normal. No respiratory distress. She has no wheezes. She has no rales.  Neurological: She is alert and oriented to person, place, and time. No cranial nerve deficit. Coordination normal.  Psychiatric: She has a normal mood and affect. Her behavior is normal.          Assessment & Plan:  Chronic tension-type headache. Not improved with Zanaflex. Discontinue Zanaflex. Trial of nortriptyline 10 mg daily at bedtime. We reviewed possible side effects. Touch base in 2-3 weeks if headaches not improving. We discussed other conservative measures such as consistent exercise, consistent sleep, moist heat and muscle massage to upper back and neck muscles

## 2015-05-29 NOTE — Progress Notes (Signed)
Pre visit review using our clinic review tool, if applicable. No additional management support is needed unless otherwise documented below in the visit note. 

## 2015-05-29 NOTE — Patient Instructions (Signed)
Touch base in 2 weeks if headaches not better

## 2015-10-06 ENCOUNTER — Ambulatory Visit (INDEPENDENT_AMBULATORY_CARE_PROVIDER_SITE_OTHER): Payer: Commercial Managed Care - HMO | Admitting: Family Medicine

## 2015-10-06 VITALS — BP 120/80 | HR 87 | Temp 99.3°F | Resp 16 | Ht 62.25 in | Wt 207.5 lb

## 2015-10-06 DIAGNOSIS — J019 Acute sinusitis, unspecified: Secondary | ICD-10-CM | POA: Diagnosis not present

## 2015-10-06 MED ORDER — AMOXICILLIN-POT CLAVULANATE 875-125 MG PO TABS: 1.0000 | ORAL_TABLET | Freq: Two times a day (BID) | ORAL | Status: DC

## 2015-10-06 NOTE — Progress Notes (Signed)
Pre visit review using our clinic review tool, if applicable. No additional management support is needed unless otherwise documented below in the visit note. 

## 2015-10-06 NOTE — Progress Notes (Signed)
   Subjective:    Patient ID: Melissa Vasquez, female    DOB: Aug 24, 1971, 44 y.o.   MRN: [id number]  HPI Patient seen with sinus congestive symptoms and progressive cough over the past 9 days or so. She has noted when she coughs she has somewhat of diffuse sharp headache bifrontal radiating to the back. She's not had any nausea or vomiting. The past several days she's had some bloody nasal discharge and thick yellow-green nasal mucus. She thinks she had low-grade fever on Sunday.  Her chronic headaches actually improved following nortriptyline and she is stopped taking that. She has not had a stiff neck. She's had occasional chills we but no documented fever since Sunday. Cough is also been productive past few days  Past Medical History  Diagnosis Date  . HYPERLIPIDEMIA 03/16/2009  . PANIC DISORDER 03/16/2009  . HYPERTENSION 03/16/2009   Past Surgical History  Procedure Laterality Date  . Bunionectomy  1995    bilateral  . Wisdom tooth extraction  1995    reports that she quit smoking about 6 years ago. Her smoking use included Cigarettes. She has a 3 pack-year smoking history. She does not have any smokeless tobacco history on file. Her alcohol and drug histories are not on file. family history includes Alcohol abuse in her other; Diabetes in her other; Heart disease in her other; Hyperlipidemia in her other; Hypertension in her other; Mental illness in her other. No Known Allergies\    Review of Systems  Constitutional: Negative for fever and chills.  HENT: Positive for congestion and sinus pressure.   Respiratory: Positive for cough.   Neurological: Positive for headaches.       Objective:   Physical Exam  Constitutional: She appears well-developed and well-nourished.  HENT:  Right Ear: External ear normal.  Left Ear: External ear normal.  Mouth/Throat: Oropharynx is clear and moist.  Erythematous nasal mucosa.  Neck: Neck supple.  Cardiovascular: Normal rate and regular rhythm.    Pulmonary/Chest: Effort normal and breath sounds normal. No respiratory distress. She has no wheezes. She has no rales.  Lymphadenopathy:    She has no cervical adenopathy.          Assessment & Plan:  Probable acute sinusitis. Start Augmentin 875 mg twice daily with food for 10 days. Stay well-hydrated. Consider Mucinex. Touch base if not improving by next week

## 2015-11-26 ENCOUNTER — Other Ambulatory Visit: Payer: Self-pay | Admitting: Family Medicine

## 2016-01-04 ENCOUNTER — Other Ambulatory Visit: Payer: Self-pay | Admitting: Obstetrics and Gynecology

## 2016-01-04 LAB — HEPATIC FUNCTION PANEL
ALK PHOS: 89 U/L (ref 25–125)
ALT: 45 U/L — AB (ref 7–35)
AST: 31 U/L (ref 13–35)
Bilirubin, Total: 0.5 mg/dL

## 2016-01-04 LAB — BASIC METABOLIC PANEL
BUN: 12 mg/dL (ref 4–21)
Creatinine: 0.6 mg/dL (ref 0.5–1.1)
Glucose: 143 mg/dL
SODIUM: 141 mmol/L (ref 137–147)

## 2016-01-04 LAB — LIPID PANEL
CHOLESTEROL: 209 mg/dL — AB (ref 0–200)
HDL: 36 mg/dL (ref 35–70)
LDL/HDL RATIO: 5.8
Triglycerides: 578 mg/dL — AB (ref 40–160)

## 2016-01-04 LAB — CBC AND DIFFERENTIAL
HEMATOCRIT: 44 % (ref 36–46)
HEMOGLOBIN: 15 g/dL (ref 12.0–16.0)
PLATELETS: 245 10*3/uL (ref 150–399)
WBC: 7.6 10^3/mL

## 2016-01-04 LAB — HM PAP SMEAR: HM PAP: NORMAL

## 2016-01-08 LAB — CYTOLOGY - PAP

## 2016-01-09 ENCOUNTER — Encounter: Payer: Self-pay | Admitting: Family Medicine

## 2016-01-11 ENCOUNTER — Encounter: Payer: Self-pay | Admitting: Family Medicine

## 2016-01-11 ENCOUNTER — Ambulatory Visit (INDEPENDENT_AMBULATORY_CARE_PROVIDER_SITE_OTHER): Payer: Commercial Managed Care - HMO | Admitting: Family Medicine

## 2016-01-11 VITALS — BP 100/80 | HR 78 | Temp 98.4°F | Ht 62.25 in | Wt 207.4 lb

## 2016-01-11 DIAGNOSIS — R739 Hyperglycemia, unspecified: Secondary | ICD-10-CM | POA: Diagnosis not present

## 2016-01-11 DIAGNOSIS — E785 Hyperlipidemia, unspecified: Secondary | ICD-10-CM | POA: Diagnosis not present

## 2016-01-11 DIAGNOSIS — R7401 Elevation of levels of liver transaminase levels: Secondary | ICD-10-CM

## 2016-01-11 DIAGNOSIS — R740 Nonspecific elevation of levels of transaminase and lactic acid dehydrogenase [LDH]: Secondary | ICD-10-CM | POA: Diagnosis not present

## 2016-01-11 DIAGNOSIS — E8881 Metabolic syndrome: Secondary | ICD-10-CM

## 2016-01-11 DIAGNOSIS — E1165 Type 2 diabetes mellitus with hyperglycemia: Secondary | ICD-10-CM | POA: Insufficient documentation

## 2016-01-11 LAB — GLUCOSE, POCT (MANUAL RESULT ENTRY): POC GLUCOSE: 130 mg/dL — AB (ref 70–99)

## 2016-01-11 LAB — POCT GLYCOSYLATED HEMOGLOBIN (HGB A1C): HEMOGLOBIN A1C: 7

## 2016-01-11 NOTE — Progress Notes (Signed)
Pre visit review using our clinic review tool, if applicable. No additional management support is needed unless otherwise documented below in the visit note. 

## 2016-01-11 NOTE — Progress Notes (Signed)
   Subjective:    Patient ID: Melissa Vasquez, female    DOB: 02-16-1971, 45 y.o.   MRN: [id number]  HPI Patient seen following recent lab work per gynecologist. She had 3 hour postprandial blood sugar 143, minimally elevated ALT of 45 with AST of 31, and triglycerides of 578 with cholesterol 209 and HDL 36. She has family history of type 2 diabetes in her mother. She has never been diagnosed with hyperglycemia. She has hypertension which is been treated for several years. No consistent exercise. No alcohol use. Currently is on blood pressure medication and also takes fluoxetine. Weight reasonably stable over recent years. She has a long history of obesity. No polyuria or polydipsia  Past Medical History  Diagnosis Date  . HYPERLIPIDEMIA 03/16/2009  . PANIC DISORDER 03/16/2009  . HYPERTENSION 03/16/2009   Past Surgical History  Procedure Laterality Date  . Bunionectomy  1995    bilateral  . Wisdom tooth extraction  1995    reports that she quit smoking about 7 years ago. Her smoking use included Cigarettes. She has a 3 pack-year smoking history. She does not have any smokeless tobacco history on file. Her alcohol and drug histories are not on file. family history includes Alcohol abuse in her other; Diabetes in her other; Heart disease in her other; Hyperlipidemia in her other; Hypertension in her other; Mental illness in her other. No Known Allergies    Review of Systems  Constitutional: Negative for fatigue.  Eyes: Negative for visual disturbance.  Respiratory: Negative for cough, chest tightness, shortness of breath and wheezing.   Cardiovascular: Negative for chest pain, palpitations and leg swelling.  Gastrointestinal: Negative for nausea, vomiting and abdominal pain.  Endocrine: Negative for polydipsia and polyuria.  Genitourinary: Negative for dysuria.  Neurological: Negative for dizziness, seizures, syncope, weakness, light-headedness and headaches.  Hematological: Negative  for adenopathy.       Objective:   Physical Exam  Constitutional: She appears well-developed and well-nourished.  Neck: Neck supple. No thyromegaly present.  Cardiovascular: Normal rate and regular rhythm.  Exam reveals no gallop.   No murmur heard. Pulmonary/Chest: Effort normal and breath sounds normal. No respiratory distress. She has no wheezes. She has no rales.  Musculoskeletal: She exhibits no edema.          Assessment & Plan:   Metabolic syndrome.-Patient clearly meets criteria with high triglyceride, low HDL, hyperglycemia, hypertension, and increased waist circumference. We discussed implications. We'll check fasting glucose and hemoglobin A1c today. Handout given on triglyceride elevations and dietary modification as well as recommendations for treating metabolic syndrome. Will recommend weight loss, increased exercise and repeat liver transaminases and lipids in about 3 months Suspect mild transaminase elevations related to fatty liver and suspect they will improve with weight loss  Fasting glucose 130 with A1c 7.0.  Offered nutritional counseling. She prefers to try to do some things with lifestyle modification as discussed and will repeat fasting labs including A1c in 3 months

## 2016-01-11 NOTE — Patient Instructions (Signed)
Metabolic Syndrome Metabolic syndrome is the presence of at least three factors that increase your risk of getting cardiovascular disease and diabetes. These factors are:  High blood sugar.  High blood triglyceride level.  High blood pressure.  Low levels of good blood cholesterol (high-density lipoprotein or HDL).  Excess weight around the waist. This factor is present with a waist measurement of:  More than 40 inches in men.  More than 35 inches in women. Metabolic syndrome is sometimes called insulin resistance syndrome and syndrome X. CAUSES The exact cause is not known, but genetics and lifestyle choices play a role. RISK FACTORS You are more likely to develop metabolic syndrome if:  You eat a diet high in calories and saturated fat.  You do not exercise regularly.  You are overweight.  You have a family history of metabolic syndrome.  You are Asian.  You are older in age.  You have insulin resistance.  You use any tobacco products, including cigarettes, chewing tobacco, or electronic cigarettes. SIGNS AND SYMPTOMS Metabolic syndrome has no specific symptoms. DIAGNOSIS To make a diagnosis, your health care provider will determine whether you have at least three of the factors that make up metabolic syndrome by:  Taking your blood pressure.  Measuring your waist.  Ordering blood tests. TREATMENT Treatment may include:  Lifestyle changes to reduce your risk for heart disease and stroke, such as:  Exercise.  Weight loss.  Maintaining a healthy diet.  Quitting the use of any tobacco products, including cigarettes, chewing tobacco, or electronic cigarettes.  Medicines that:  Help your body to maintain glucose control.  Reduce your blood pressure and your blood triglyceride levels. HOME CARE INSTRUCTIONS  Exercise regularly.  Maintain a healthy diet.  Do not use any tobacco products, including cigarettes, chewing tobacco, or electronic cigarettes.  If you need help quitting, ask your health care provider.  Keep all follow-up visits as directed by your health care provider. This is important.  Measure your waist regularly and record the measurement. To measure your waist:  Stand up straight.  Breathe out.  Wrap the measuring tape around the part of your waist that is just above your hipbones.  Read the measurement. SEEK MEDICAL CARE IF:  You feel very tired.  You develop excessive thirst.  You pass large quantities of urine.  You put on weight around your waist.  You have headaches over and over again.  You have a dizzy spell. SEEK IMMEDIATE MEDICAL CARE IF:  You develop sudden blurred vision.  You develop a sudden dizzy spell.  You have sudden trouble speaking or swallowing.  You have sudden weakness in your arm or leg.  You have chest pains or trouble breathing.  You feel like your heartbeat is abnormal.  You faint.   This information is not intended to replace advice given to you by your health care provider. Make sure you discuss any questions you have with your health care provider.   Document Released: 02/11/2008 Document Revised: 11/25/2014 Document Reviewed: 06/10/2014 Elsevier Interactive Patient Education 2016 [place]. Diet for Metabolic Syndrome Metabolic syndrome is a disorder that includes at least three of these conditions:  Abdominal obesity.  Too much sugar in your blood.  High blood pressure.  Higher than normal amount of fat (lipids) in your blood.  Lower than normal level of "good" cholesterol (HDL). Following a healthy diet can help to keep metabolic syndrome under control. It can also help to prevent the development of conditions that are associated  with metabolic syndrome, such as diabetes, heart disease, and stroke. Along with exercise, a healthy diet:  Helps to improve the way that the body uses insulin.  Promotes weight loss. A common goal for people with this condition  is to lose at least 7 to 10 percent of their starting weight. WHAT DO I NEED TO KNOW ABOUT THIS DIET?  Use the glycemic index (GI) to plan your meals. The index tells you how quickly a food will raise your blood sugar. Choose foods that have low GI values. These foods take a longer time to raise blood sugar.  Keep track of how many calories you take in. Eating the right amount of calories will help your achieve a healthy weight.  You may want to follow a Mediterranean diet. This diet includes lots of vegetables, lean meats or fish, whole grains, fruits, and healthy oils and fats. WHAT FOODS CAN I EAT? Grains Stone-ground whole wheat. Pumpernickel bread. Whole-grain bread, crackers, tortillas, cereal, and pasta. Unsweetened oatmeal.Bulgur.Barley.Quinoa.Brown rice or wild rice. Vegetables Lettuce. Spinach. Peas. Beets. Cauliflower. Cabbage. Broccoli. Carrots. Tomatoes. Squash. Eggplant. Herbs. Peppers. Onions. Cucumbers. Brussels sprouts. Sweet potatoes. Yams. Beans. Lentils. Fruits Berries. Apples. Oranges. Grapes. Mango. Pomegranate. Kiwi. Cherries. Meats and Other Protein Sources Seafood and shellfish. Lean meats.Poultry. Tofu. Dairy Low-fat or fat-free dairy products, such as milk, yogurt, and cheese. Beverages Water. Low-fat milk. Milk alternatives, like soy milk or almond milk. Real fruit juice. Condiments Low-sugar or sugar-free ketchup, barbecue sauce, and mayonnaise. Mustard. Relish. Fats and Oils Avocado. Canola or olive oil. Nuts and nut butters.Seeds. The items listed above may not be a complete list of recommended foods or beverages. Contact your dietitian for more options.  WHAT FOODS ARE NOT RECOMMENDED? Red meat. Palm oil and coconut oil. Processed foods. Fried foods. Alcohol. Sweetened drinks, such as iced tea and soda. Sweets. Salty foods. The items listed above may not be a complete list of foods and beverages to avoid. Contact your dietitian for more information.    This information is not intended to replace advice given to you by your health care provider. Make sure you discuss any questions you have with your health care provider.   Document Released: 03/21/2015 Document Reviewed: 03/21/2015 Elsevier Interactive Patient Education 2016 [place] Choices to Lower Your Triglycerides Triglycerides are a type of fat in your blood. High levels of triglycerides can increase the risk of heart disease and stroke. If your triglyceride levels are high, the foods you eat and your eating habits are very important. Choosing the right foods can help lower your triglycerides.  WHAT GENERAL GUIDELINES DO I NEED TO FOLLOW?  Lose weight if you are overweight.   Limit or avoid alcohol.   Fill one half of your plate with vegetables and green salads.   Limit fruit to two servings a day. Choose fruit instead of juice.   Make one fourth of your plate whole grains. Look for the word "whole" as the first word in the ingredient list.  Fill one fourth of your plate with lean protein foods.  Enjoy fatty fish (such as salmon, mackerel, sardines, and tuna) three times a week.   Choose healthy fats.   Limit foods high in starch and sugar.  Eat more home-cooked food and less restaurant, buffet, and fast food.  Limit fried foods.  Cook foods using methods other than frying.  Limit saturated fats.  Check ingredient lists to avoid foods with partially hydrogenated oils (trans fats) in them. WHAT FOODS CAN  I EAT?  Grains Whole grains, such as whole wheat or whole grain breads, crackers, cereals, and pasta. Unsweetened oatmeal, bulgur, barley, quinoa, or brown rice. Corn or whole wheat flour tortillas.  Vegetables Fresh or frozen vegetables (raw, steamed, roasted, or grilled). Green salads. Fruits All fresh, canned (in natural juice), or frozen fruits. Meat and Other Protein Products Ground beef (85% or leaner), grass-fed beef, or beef trimmed of fat.  Skinless chicken or [place]. Ground chicken or [place]. Pork trimmed of fat. All fish and seafood. Eggs. Dried beans, peas, or lentils. Unsalted nuts or seeds. Unsalted canned or dry beans. Dairy Low-fat dairy products, such as skim or 1% milk, 2% or reduced-fat cheeses, low-fat ricotta or cottage cheese, or plain low-fat yogurt. Fats and Oils Tub margarines without trans fats. Light or reduced-fat mayonnaise and salad dressings. Avocado. Safflower, olive, or canola oils. Natural peanut or almond butter. The items listed above may not be a complete list of recommended foods or beverages. Contact your dietitian for more options. WHAT FOODS ARE NOT RECOMMENDED?  Grains White bread. White pasta. White rice. Cornbread. Bagels, pastries, and croissants. Crackers that contain trans fat. Vegetables White potatoes. Corn. Creamed or fried vegetables. Vegetables in a cheese sauce. Fruits Dried fruits. Canned fruit in light or heavy syrup. Fruit juice. Meat and Other Protein Products Fatty cuts of meat. Ribs, chicken wings, bacon, sausage, bologna, salami, chitterlings, fatback, hot dogs, bratwurst, and packaged luncheon meats. Dairy Whole or 2% milk, cream, half-and-half, and cream cheese. Whole-fat or sweetened yogurt. Full-fat cheeses. Nondairy creamers and whipped toppings. Processed cheese, cheese spreads, or cheese curds. Sweets and Desserts Corn syrup, sugars, honey, and molasses. Candy. Jam and jelly. Syrup. Sweetened cereals. Cookies, pies, cakes, donuts, muffins, and ice cream. Fats and Oils Butter, stick margarine, lard, shortening, ghee, or bacon fat. Coconut, palm kernel, or palm oils. Beverages Alcohol. Sweetened drinks (such as sodas, lemonade, and fruit drinks or punches). The items listed above may not be a complete list of foods and beverages to avoid. Contact your dietitian for more information.   This information is not intended to replace advice given to you by your health care  provider. Make sure you discuss any questions you have with your health care provider.   Document Released: 08/22/2004 Document Revised: 11/25/2014 Document Reviewed: 09/08/2013 Elsevier Interactive Patient Education [employer].

## 2016-01-12 ENCOUNTER — Ambulatory Visit: Payer: Self-pay | Admitting: Family Medicine

## 2016-01-29 ENCOUNTER — Telehealth: Payer: Commercial Managed Care - HMO | Admitting: Family

## 2016-01-29 DIAGNOSIS — H1089 Other conjunctivitis: Secondary | ICD-10-CM

## 2016-01-29 DIAGNOSIS — A499 Bacterial infection, unspecified: Secondary | ICD-10-CM

## 2016-01-29 DIAGNOSIS — H109 Unspecified conjunctivitis: Secondary | ICD-10-CM

## 2016-01-29 MED ORDER — POLYMYXIN B-TRIMETHOPRIM 10000-0.1 UNIT/ML-% OP SOLN: 1.0000 [drp] | OPHTHALMIC | Status: DC

## 2016-01-29 NOTE — Progress Notes (Signed)
ATTENTION: Please STOP using the eye drops from your family member. This is very dangerous as bacteria often get on the tip of these droppers accidentally and may worsen your infection by exposing you to other infectious bacteria. I have prescribed eyedrops for your use below.   We are sorry that you are not feeling well.  Here is how we plan to help!  Based on what you have shared with me it looks like you have conjunctivitis.  Conjunctivitis is a common inflammatory or infectious condition of the eye that is often referred to as "pink eye".  In most cases it is contagious (viral or bacterial). However, not all conjunctivitis requires antibiotics (ex. Allergic).  We have made appropriate suggestions for you based upon your presentation.  I have prescribed Polytrim Ophthalmic drops 1 drop per eye every 4 hours (6 times a day times 5 days)  Pink eye can be highly contagious.  It is typically spread through direct contact with secretions, or contaminated objects or surfaces that one may have touched.  Strict handwashing is suggested with soap and water is urged.  If not available, use alcohol based had sanitizer.  Avoid unnecessary touching of the eye.  If you wear contact lenses, you will need to refrain from wearing them until you see no white discharge from the eye for at least 24 hours after being on medication.  You should see symptom improvement in 1-2 days after starting the medication regimen.  Call us if symptoms are not improved in 1-2 days.  Home Care:  Wash your hands often!  Do not wear your contacts until you complete your treatment plan.  Avoid sharing towels, bed linen, personal items with a person who has pink eye.  See attention for anyone in your home with similar symptoms.  Get Help Right Away If:  Your symptoms do not improve.  You develop blurred or loss of vision.  Your symptoms worsen (increased discharge, pain or redness)  Your e-visit answers were reviewed by a  board certified advanced clinical practitioner to complete your personal care plan.  Depending on the condition, your plan could have included both over the counter or prescription medications.  If there is a problem please reply  once you have received a response from your provider.  Your safety is important to [place].  If you have drug allergies check your prescription carefully.    You can use MyChart to ask questions about today's visit, request a non-urgent call back, or ask for a work or school excuse for 24 hours related to this e-Visit. If it has been greater than 24 hours you will need to follow up with your provider, or enter a new e-Visit to address those concerns.   You will get an e-mail in the next two days asking about your experience.  I hope that your e-visit has been valuable and will speed your recovery. Thank you for using e-visits.

## 2016-02-12 ENCOUNTER — Ambulatory Visit (INDEPENDENT_AMBULATORY_CARE_PROVIDER_SITE_OTHER): Payer: Commercial Managed Care - HMO | Admitting: Family Medicine

## 2016-02-12 VITALS — BP 112/80 | HR 66 | Temp 98.0°F | Ht 62.25 in | Wt 198.2 lb

## 2016-02-12 DIAGNOSIS — H1013 Acute atopic conjunctivitis, bilateral: Secondary | ICD-10-CM | POA: Diagnosis not present

## 2016-02-12 MED ORDER — OLOPATADINE HCL 0.1 % OP SOLN: 1.0000 [drp] | Freq: Two times a day (BID) | OPHTHALMIC | Status: DC

## 2016-02-12 NOTE — Patient Instructions (Signed)
Allergic Conjunctivitis Allergic conjunctivitis is inflammation of the clear membrane that covers the white part of your eye and the inner surface of your eyelid (conjunctiva), and it is caused by allergies. The blood vessels in the conjunctiva become inflamed, and this causes the eye to become red or pink, and it often causes itchiness in the eye. Allergic conjunctivitis cannot be spread by one person to another person (noncontagious). CAUSES This condition is caused by an allergic reaction. Common causes of an allergic reaction (allergens) include:  Dust.  Pollen.  Mold.  Animal dander or secretions. RISK FACTORS This condition is more likely to develop if you are exposed to high levels of allergens that cause the allergic reaction. This might include being outdoors when air pollen levels are high or being around animals that you are allergic to. SYMPTOMS Symptoms of this condition may include:  Eye redness.  Tearing of the eyes.  Watery eyes.  Itchy eyes.  Burning feeling in the eyes.  Clear drainage from the eyes.  Swollen eyelids. DIAGNOSIS This condition may be diagnosed by medical history and physical exam. If you have drainage from your eyes, it may be tested to rule out other causes of conjunctivitis. TREATMENT Treatment for this condition often includes medicines. These may be eye drops, ointments, or oral medicines. They may be prescription medicines or over-the-counter medicines. HOME CARE INSTRUCTIONS  Take or apply medicines only as directed by your health care provider.  Do not touch or rub your eyes.  Do not wear contact lenses until the inflammation is gone. Wear glasses instead.  Do not wear eye makeup until the inflammation is gone.  Apply a cool, clean washcloth to your eye for 10-20 minutes, 3-4 times a day.  Try to avoid whatever allergen is causing the allergic reaction. SEEK MEDICAL CARE IF:  Your symptoms get worse.  You have pus draining  from your eye.  You have new symptoms.  You have a fever.   This information is not intended to replace advice given to you by your health care provider. Make sure you discuss any questions you have with your health care provider.   Document Released: 01/25/2003 Document Revised: 11/25/2014 Document Reviewed: 08/16/2014 Elsevier Interactive Patient Education 2016 [employer]  

## 2016-02-12 NOTE — Progress Notes (Signed)
   Subjective:    Patient ID: Melissa Vasquez, female    DOB: Aug 01, 1971, 45 y.o.   MRN: [id number]  HPI Two and one half week history of left eye symptoms greater than right. She describes some redness of the leg where watery discharge. No matting. No eye pain. No blurred vision. She thinks is maybe allergic. She's had occasional nasal congestion. No fevers or chills. She did an E- visit and was prescribed Polytrim ophthalmic drops which did not help. No contact use.  Past Medical History  Diagnosis Date  . HYPERLIPIDEMIA 03/16/2009  . PANIC DISORDER 03/16/2009  . HYPERTENSION 03/16/2009   Past Surgical History  Procedure Laterality Date  . Bunionectomy  1995    bilateral  . Wisdom tooth extraction  1995    reports that she quit smoking about 7 years ago. Her smoking use included Cigarettes. She has a 3 pack-year smoking history. She does not have any smokeless tobacco history on file. Her alcohol and drug histories are not on file. family history includes Alcohol abuse in her other; Diabetes in her other; Heart disease in her other; Hyperlipidemia in her other; Hypertension in her other; Mental illness in her other. No Known Allergies    Review of Systems  Constitutional: Negative for fever and chills.  Eyes: Positive for redness and itching. Negative for pain, discharge and visual disturbance.       Objective:   Physical Exam  Constitutional: She appears well-developed and well-nourished.  Eyes: Conjunctivae are normal. Pupils are equal, round, and reactive to light. Right eye exhibits no discharge. Left eye exhibits no discharge.  Conjunctiva and cornea appear normal. No discharge. Minimal episcleral vessel prominence.  Cardiovascular: Normal rate and regular rhythm.           Assessment & Plan:  Allergic conjunctivitis. Try over-the-counter Claritin or Zyrtec. Patanol eyedrops 1 drop each eye twice daily as needed. Touch base if not getting relief within the next few  days

## 2016-02-12 NOTE — Progress Notes (Signed)
Pre visit review using our clinic review tool, if applicable. No additional management support is needed unless otherwise documented below in the visit note. 

## 2016-03-13 ENCOUNTER — Other Ambulatory Visit: Payer: Self-pay | Admitting: Physician Assistant

## 2016-04-09 ENCOUNTER — Encounter: Payer: Self-pay | Admitting: Family Medicine

## 2016-04-11 ENCOUNTER — Other Ambulatory Visit (INDEPENDENT_AMBULATORY_CARE_PROVIDER_SITE_OTHER): Payer: Commercial Managed Care - HMO

## 2016-04-11 DIAGNOSIS — R740 Nonspecific elevation of levels of transaminase and lactic acid dehydrogenase [LDH]: Secondary | ICD-10-CM | POA: Diagnosis not present

## 2016-04-11 DIAGNOSIS — E785 Hyperlipidemia, unspecified: Secondary | ICD-10-CM | POA: Diagnosis not present

## 2016-04-11 DIAGNOSIS — R7989 Other specified abnormal findings of blood chemistry: Secondary | ICD-10-CM

## 2016-04-11 DIAGNOSIS — R739 Hyperglycemia, unspecified: Secondary | ICD-10-CM | POA: Diagnosis not present

## 2016-04-11 DIAGNOSIS — R7401 Elevation of levels of liver transaminase levels: Secondary | ICD-10-CM

## 2016-04-11 DIAGNOSIS — E8881 Metabolic syndrome: Secondary | ICD-10-CM

## 2016-04-11 LAB — BASIC METABOLIC PANEL
BUN: 16 mg/dL (ref 6–23)
CALCIUM: 9.1 mg/dL (ref 8.4–10.5)
CHLORIDE: 103 meq/L (ref 96–112)
CO2: 29 meq/L (ref 19–32)
CREATININE: 0.59 mg/dL (ref 0.40–1.20)
GFR: 117.61 mL/min (ref >60.00)
GLUCOSE: 103 mg/dL — AB (ref 70–99)
Potassium: 4.4 mEq/L (ref 3.5–5.1)
Sodium: 137 mEq/L (ref 135–145)

## 2016-04-11 LAB — LIPID PANEL
Cholesterol: 199 mg/dL (ref 0–200)
HDL: 30.3 mg/dL — ABNORMAL LOW (ref >39.00)
Total CHOL/HDL Ratio: 7
Triglycerides: 498 mg/dL — ABNORMAL HIGH (ref 0.0–149.0)

## 2016-04-11 LAB — HEPATIC FUNCTION PANEL
ALK PHOS: 66 U/L (ref 39–117)
ALT: 20 U/L (ref 0–35)
AST: 16 U/L (ref 0–37)
Albumin: 4.1 g/dL (ref 3.5–5.2)
Bilirubin, Direct: 0.1 mg/dL (ref 0.0–0.3)
Total Bilirubin: 0.5 mg/dL (ref 0.2–1.2)
Total Protein: 6.4 g/dL (ref 6.0–8.3)

## 2016-04-11 LAB — HEMOGLOBIN A1C: Hgb A1c MFr Bld: 5.9 % (ref 4.6–6.5)

## 2016-04-11 LAB — LDL CHOLESTEROL, DIRECT: Direct LDL: 82 mg/dL

## 2016-04-17 ENCOUNTER — Ambulatory Visit (INDEPENDENT_AMBULATORY_CARE_PROVIDER_SITE_OTHER): Payer: Commercial Managed Care - HMO | Admitting: Family Medicine

## 2016-04-17 VITALS — BP 108/80 | HR 83 | Temp 98.2°F | Ht 62.25 in | Wt 196.0 lb

## 2016-04-17 DIAGNOSIS — E785 Hyperlipidemia, unspecified: Secondary | ICD-10-CM | POA: Diagnosis not present

## 2016-04-17 DIAGNOSIS — I10 Essential (primary) hypertension: Secondary | ICD-10-CM

## 2016-04-17 DIAGNOSIS — E8881 Metabolic syndrome: Secondary | ICD-10-CM

## 2016-04-17 NOTE — Patient Instructions (Signed)

## 2016-04-17 NOTE — Progress Notes (Signed)
Pre visit review using our clinic review tool, if applicable. No additional management support is needed unless otherwise documented below in the visit note. 

## 2016-04-17 NOTE — Progress Notes (Signed)
   Subjective:    Patient ID: Melissa Vasquez, female    DOB: 11/29/1970, 45 y.o.   MRN: [id number]  HPI Follow-up regarding metabolic syndrome-hypertension, hyperglycemia, hypertriglyceridemia Patient done tremendous job with lifestyle management since last visit She is walking most days of the week and is scaled back sugars and starches. Eating less processed food. More fruits and vegetables She's lost about 10 pounds She has strong family history type 2 diabetes.  Recent labs much improved. Glucose went from 143 to 103. A1c 7.0% to 5.9% Triglycerides are still high but slightly improved. She has low HDL. LDL well controlled. No symptoms of polyuria or polydipsia. Blood pressure very well controlled  Past Medical History  Diagnosis Date  . HYPERLIPIDEMIA 03/16/2009  . PANIC DISORDER 03/16/2009  . HYPERTENSION 03/16/2009   Past Surgical History  Procedure Laterality Date  . Bunionectomy  1995    bilateral  . Wisdom tooth extraction  1995    reports that she quit smoking about 7 years ago. Her smoking use included Cigarettes. She has a 3 pack-year smoking history. She does not have any smokeless tobacco history on file. Her alcohol and drug histories are not on file. family history includes Alcohol abuse in her other; Diabetes in her other; Heart disease in her other; Hyperlipidemia in her other; Hypertension in her other; Mental illness in her other. No Known Allergies    Review of Systems  Constitutional: Negative for fatigue and unexpected weight change.  Eyes: Negative for visual disturbance.  Respiratory: Negative for cough, chest tightness, shortness of breath and wheezing.   Cardiovascular: Negative for chest pain, palpitations and leg swelling.  Endocrine: Negative for polydipsia and polyuria.  Neurological: Negative for dizziness, seizures, syncope, weakness, light-headedness and headaches.       Objective:   Physical Exam  Constitutional: She appears well-developed and  well-nourished.  Eyes: Pupils are equal, round, and reactive to light.  Neck: Neck supple. No JVD present. No thyromegaly present.  Cardiovascular: Normal rate and regular rhythm.  Exam reveals no gallop.   Pulmonary/Chest: Effort normal and breath sounds normal. No respiratory distress. She has no wheezes. She has no rales.  Musculoskeletal: She exhibits no edema.  Neurological: She is alert.          Assessment & Plan:  Metabolic syndrome. Patient is made tremendous lifestyle changes as above. She has seen improvements in her fasting blood sugar, A1c, and blood pressure. Her weight is down 10 pounds. She is encouraged to continue with current changes. She also had recent mildly elevated liver transaminase which is now normal with weight loss. Suspect fatty liver related. We've recommended a six-month follow-up and we'll plan repeat fasting lipids and A1c at that time. Handout on Mediterranean diet given. She will continue to focus on omega-3 supplements and weight loss as well as reduction sugars and starches to help manage her triglycerides.  Eulas Post MD [facility] at [facility]

## 2016-04-24 ENCOUNTER — Ambulatory Visit (INDEPENDENT_AMBULATORY_CARE_PROVIDER_SITE_OTHER): Payer: Commercial Managed Care - HMO | Admitting: Family Medicine

## 2016-04-24 VITALS — BP 100/70 | HR 89 | Temp 99.3°F | Ht 62.25 in | Wt 194.0 lb

## 2016-04-24 DIAGNOSIS — J019 Acute sinusitis, unspecified: Secondary | ICD-10-CM | POA: Diagnosis not present

## 2016-04-24 MED ORDER — AZITHROMYCIN 250 MG PO TABS: ORAL_TABLET | ORAL | Status: AC

## 2016-04-24 NOTE — Progress Notes (Signed)
   Subjective:    Patient ID: Melissa Vasquez, female    DOB: Sep 02, 1971, 45 y.o.   MRN: [id number]  HPI  Acute visit. Five-day history of nasal congestion and frontal sinus pressure. Started out with clear drainage and now purulent. Occasional headaches. No fevers or chills. Increased malaise. Some myalgias. No nausea or vomiting.  Past Medical History  Diagnosis Date  . HYPERLIPIDEMIA 03/16/2009  . PANIC DISORDER 03/16/2009  . HYPERTENSION 03/16/2009   Past Surgical History  Procedure Laterality Date  . Bunionectomy  1995    bilateral  . Wisdom tooth extraction  1995    reports that she quit smoking about 7 years ago. Her smoking use included Cigarettes. She has a 3 pack-year smoking history. She does not have any smokeless tobacco history on file. Her alcohol and drug histories are not on file. family history includes Alcohol abuse in her other; Diabetes in her other; Heart disease in her other; Hyperlipidemia in her other; Hypertension in her other; Mental illness in her other. No Known Allergies   Review of Systems  Constitutional: Positive for fever, chills and fatigue.  HENT: Positive for congestion, sinus pressure and sore throat.   Respiratory: Positive for cough.   Neurological: Positive for headaches.       Objective:   Physical Exam  Constitutional: She appears well-developed and well-nourished.  HENT:  Right Ear: External ear normal.  Left Ear: External ear normal.  Mouth/Throat: Oropharynx is clear and moist.  Neck: Neck supple.  Cardiovascular: Normal rate and regular rhythm.   Pulmonary/Chest: Effort normal and breath sounds normal. No respiratory distress. She has no wheezes. She has no rales.  Lymphadenopathy:    She has no cervical adenopathy.          Assessment & Plan:  Acute sinusitis. We explained the most sinusitis is viral. We've recommended she try over-the-counter Mucinex and increased hydration. Consider start Zithromax if symptoms not  improving of the next few days or especially for any worsening symptoms  Eulas Post MD [facility] at [facility]

## 2016-04-24 NOTE — Patient Instructions (Signed)

## 2016-05-03 ENCOUNTER — Other Ambulatory Visit: Payer: Self-pay | Admitting: Family Medicine

## 2016-05-03 NOTE — Telephone Encounter (Signed)
Rx refill sent to pharmacy. 

## 2016-06-01 ENCOUNTER — Other Ambulatory Visit: Payer: Self-pay | Admitting: Family Medicine

## 2016-09-03 ENCOUNTER — Encounter: Payer: Self-pay | Admitting: Family Medicine

## 2016-10-27 ENCOUNTER — Other Ambulatory Visit: Payer: Self-pay | Admitting: Family Medicine

## 2017-04-26 ENCOUNTER — Other Ambulatory Visit: Payer: Self-pay | Admitting: Family Medicine

## 2017-06-02 ENCOUNTER — Encounter: Payer: Self-pay | Admitting: Family Medicine

## 2017-06-05 NOTE — Telephone Encounter (Signed)
Pt would like to have a Tdap vaccine done at her 06/25/17 appointment is she eligible for one?

## 2017-06-25 ENCOUNTER — Ambulatory Visit (INDEPENDENT_AMBULATORY_CARE_PROVIDER_SITE_OTHER): Payer: 59 | Admitting: Family Medicine

## 2017-06-25 ENCOUNTER — Encounter: Payer: Self-pay | Admitting: Family Medicine

## 2017-06-25 ENCOUNTER — Other Ambulatory Visit: Payer: Self-pay | Admitting: Family Medicine

## 2017-06-25 VITALS — BP 120/84 | HR 76 | Temp 98.7°F | Ht 62.5 in | Wt 202.2 lb

## 2017-06-25 DIAGNOSIS — Z0000 Encounter for general adult medical examination without abnormal findings: Secondary | ICD-10-CM

## 2017-06-25 DIAGNOSIS — I10 Essential (primary) hypertension: Secondary | ICD-10-CM | POA: Diagnosis not present

## 2017-06-25 DIAGNOSIS — Z23 Encounter for immunization: Secondary | ICD-10-CM | POA: Diagnosis not present

## 2017-06-25 LAB — LDL CHOLESTEROL, DIRECT: Direct LDL: 63 mg/dL

## 2017-06-25 LAB — LIPID PANEL
Cholesterol: 215 mg/dL — ABNORMAL HIGH (ref 0–200)
HDL: 38.5 mg/dL — ABNORMAL LOW (ref >39.00)
Total CHOL/HDL Ratio: 6

## 2017-06-25 LAB — CBC WITH DIFFERENTIAL/PLATELET
BASOS PCT: 0.6 % (ref 0.0–3.0)
Basophils Absolute: 0 10*3/uL (ref 0.0–0.1)
EOS PCT: 1.4 % (ref 0.0–5.0)
Eosinophils Absolute: 0.1 10*3/uL (ref 0.0–0.7)
HCT: 45.1 % (ref 36.0–46.0)
Hemoglobin: 15.3 g/dL — ABNORMAL HIGH (ref 12.0–15.0)
LYMPHS ABS: 2.4 10*3/uL (ref 0.7–4.0)
LYMPHS PCT: 32.9 % (ref 12.0–46.0)
MCHC: 34 g/dL (ref 30.0–36.0)
MCV: 88.9 fl (ref 78.0–100.0)
Monocytes Absolute: 0.5 10*3/uL (ref 0.1–1.0)
Monocytes Relative: 6.4 % (ref 3.0–12.0)
NEUTROS ABS: 4.2 10*3/uL (ref 1.4–7.7)
NEUTROS PCT: 58.7 % (ref 43.0–77.0)
PLATELETS: 258 10*3/uL (ref 150.0–400.0)
RBC: 5.07 Mil/uL (ref 3.87–5.11)
RDW: 13.5 % (ref 11.5–15.5)
WBC: 7.2 10*3/uL (ref 4.0–10.5)

## 2017-06-25 LAB — HEPATIC FUNCTION PANEL
ALBUMIN: 4.2 g/dL (ref 3.5–5.2)
ALK PHOS: 73 U/L (ref 39–117)
ALT: 23 U/L (ref 0–35)
AST: 18 U/L (ref 0–37)
Bilirubin, Direct: 0.1 mg/dL (ref 0.0–0.3)
TOTAL PROTEIN: 6.9 g/dL (ref 6.0–8.3)
Total Bilirubin: 0.6 mg/dL (ref 0.2–1.2)

## 2017-06-25 LAB — FOLLICLE STIMULATING HORMONE: FSH: 7.4 m[IU]/mL

## 2017-06-25 LAB — TSH: TSH: 3.22 u[IU]/mL (ref 0.35–4.50)

## 2017-06-25 LAB — BASIC METABOLIC PANEL
BUN: 15 mg/dL (ref 6–23)
CALCIUM: 9.3 mg/dL (ref 8.4–10.5)
CO2: 32 meq/L (ref 19–32)
CREATININE: 0.61 mg/dL (ref 0.40–1.20)
Chloride: 99 mEq/L (ref 96–112)
GFR: 112.56 mL/min (ref >60.00)
Glucose, Bld: 98 mg/dL (ref 70–99)
Potassium: 4.5 mEq/L (ref 3.5–5.1)
Sodium: 137 mEq/L (ref 135–145)

## 2017-06-25 LAB — HEMOGLOBIN A1C: Hgb A1c MFr Bld: 6.2 % (ref 4.6–6.5)

## 2017-06-25 LAB — LUTEINIZING HORMONE: LH: 2.42 m[IU]/mL

## 2017-06-25 NOTE — Progress Notes (Signed)
Subjective:     Patient ID: Melissa Vasquez, female   DOB: [birth year], 46 y.o.   MRN: [id number]  HPI Patient seen for physical exam. She sees gynecologist yearly. She's had previous endometrial ablation. She thinks she may be going to menopause. She's had occasional hot flashes. She is requesting [place] and LH levels.  Last tetanus reportedly 1999. She is getting yearly mammograms. Chronic problems include history of metabolic syndrome, dyslipidemia, prediabetes, hypertension. She's had some weight gain over the past several months. She attributes this to increased stress levels. Strong family history of type 2 diabetes.  Past Medical History:  Diagnosis Date  . HYPERLIPIDEMIA 03/16/2009  . HYPERTENSION 03/16/2009  . PANIC DISORDER 03/16/2009   Past Surgical History:  Procedure Laterality Date  . BUNIONECTOMY  1995   bilateral  . [place]    reports that she quit smoking about 8 years ago. Her smoking use included Cigarettes. She has a 3.00 pack-year smoking history. She has never used smokeless tobacco. Her alcohol and drug histories are not on file. family history includes Alcohol abuse in her other; Diabetes in her other; Heart disease in her other; Hyperlipidemia in her other; Hypertension in her other; Mental illness in her other. No Known Allergies   Review of Systems  Constitutional: Negative for activity change, appetite change, fatigue, fever and unexpected weight change.  HENT: Negative for ear pain, hearing loss, sore throat and trouble swallowing.   Eyes: Negative for visual disturbance.  Respiratory: Negative for cough and shortness of breath.   Cardiovascular: Negative for chest pain and palpitations.  Gastrointestinal: Negative for abdominal pain, blood in stool, constipation and diarrhea.  Genitourinary: Negative for dysuria and hematuria.  Musculoskeletal: Negative for arthralgias, back pain and myalgias.  Skin: Negative for rash.  Neurological: Negative  for dizziness, syncope and headaches.  Hematological: Negative for adenopathy.  Psychiatric/Behavioral: Negative for confusion and dysphoric mood.       Objective:   Physical Exam  Constitutional: She is oriented to person, place, and time. She appears well-developed and well-nourished.  HENT:  Head: Normocephalic and atraumatic.  Eyes: Pupils are equal, round, and reactive to light. EOM are normal.  Neck: Normal range of motion. Neck supple. No thyromegaly present.  Cardiovascular: Normal rate, regular rhythm and normal heart sounds.   No murmur heard. Pulmonary/Chest: Breath sounds normal. No respiratory distress. She has no wheezes. She has no rales.  Abdominal: Soft. Bowel sounds are normal. She exhibits no distension and no mass. There is no tenderness. There is no rebound and no guarding.  Genitourinary:  Genitourinary Comments: Per GYN  Musculoskeletal: Normal range of motion. She exhibits no edema.  Lymphadenopathy:    She has no cervical adenopathy.  Neurological: She is alert and oriented to person, place, and time. She displays normal reflexes. No cranial nerve deficit.  Skin: No rash noted.  Psychiatric: She has a normal mood and affect. Her behavior is normal. Judgment and thought content normal.       Assessment:     Physical exam. Patient needs tetanus booster. She has stable chronic medical problems as above.    Plan:     -Obtain screening lab work and include A1c with prior history of mild hyperglycemia and positive family history of type 2 diabetes -Tetanus booster given -She is strongly urged to lose some weight -She'll continue throat GYN follow-up -Patient requesting [place] and LH levels. We explained we were not sure insurance would cover these but she requests  them nevertheless  Eulas Post MD [facility] at [facility]

## 2017-07-03 DIAGNOSIS — Z01419 Encounter for gynecological examination (general) (routine) without abnormal findings: Secondary | ICD-10-CM | POA: Diagnosis not present

## 2017-07-03 DIAGNOSIS — Z1231 Encounter for screening mammogram for malignant neoplasm of breast: Secondary | ICD-10-CM | POA: Diagnosis not present

## 2017-07-07 ENCOUNTER — Encounter: Payer: Self-pay | Admitting: Family Medicine

## 2017-07-11 DIAGNOSIS — J310 Chronic rhinitis: Secondary | ICD-10-CM | POA: Diagnosis not present

## 2017-07-11 DIAGNOSIS — J3501 Chronic tonsillitis: Secondary | ICD-10-CM | POA: Diagnosis not present

## 2017-07-16 DIAGNOSIS — L409 Psoriasis, unspecified: Secondary | ICD-10-CM | POA: Diagnosis not present

## 2017-07-16 DIAGNOSIS — D229 Melanocytic nevi, unspecified: Secondary | ICD-10-CM | POA: Diagnosis not present

## 2017-07-16 DIAGNOSIS — L570 Actinic keratosis: Secondary | ICD-10-CM | POA: Diagnosis not present

## 2017-07-16 DIAGNOSIS — L820 Inflamed seborrheic keratosis: Secondary | ICD-10-CM | POA: Diagnosis not present

## 2017-07-23 NOTE — Progress Notes (Signed)
[name] [place], [place] [address] Phone: [phone] Subjective:    I'm seeing this patient by the request  of:  Eulas Post, MD   CC: Left shoulder pain  [id number]  Melissa Vasquez is a 46 y.o. female coming in with complaint of left shoulder pain. She has been having pain for the past month. She went to step over a gait and tripped and she hit the floor. Her shoulder has been constantly hurting since then with movements away from her side. Does not recall a pop or snap with injury. Sleeping has been hard as she is a left side sleeper.   Onset-1.5 months ago  Location- left shoulder Duration- intermittent Character-achy and sharp Aggravating factors- Reliving factors- moving arm away from body Therapies tried- IBU, hot showers, icy hot Severity- 8/10     Past Medical History:  Diagnosis Date  . HYPERLIPIDEMIA 03/16/2009  . HYPERTENSION 03/16/2009  . PANIC DISORDER 03/16/2009   Past Surgical History:  Procedure Laterality Date  . BUNIONECTOMY  1995   bilateral  . [place] EXTRACTION  1995   Social History   Social History  . Marital status: Married    Spouse name: N/A  . Number of children: N/A  . Years of education: N/A   Social History Main Topics  . Smoking status: Former Smoker    Packs/day: 0.20    Years: 15.00    Types: Cigarettes    Quit date: 01/02/2009  . Smokeless tobacco: Never Used  . Alcohol use Not on file  . Drug use: Unknown  . Sexual activity: Not on file   Other Topics Concern  . Not on file   Social History Narrative  . No narrative on file   No Known Allergies Family History  Problem Relation Age of Onset  . Alcohol abuse Other   . Hyperlipidemia Other   . Hypertension Other   . Mental illness Other   . Diabetes Other   . Heart disease Other      Past medical history, social, surgical and family history all reviewed in electronic medical record.  No pertanent  information unless stated regarding to the chief complaint.   Review of Systems:Review of systems updated and as accurate as of 07/23/17  No headache, visual changes, nausea, vomiting, diarrhea, constipation, dizziness, abdominal pain, skin rash, fevers, chills, night sweats, weight loss, swollen lymph nodes, body aches, joint swelling, muscle aches, chest pain, shortness of breath, mood changes.   Objective  There were no vitals taken for this visit. Systems examined below as of 07/23/17   General: No apparent distress alert and oriented x3 mood and affect normal, dressed appropriately.  HEENT: Pupils equal, extraocular movements intact  Respiratory: Patient's speak in full sentences and does not appear short of breath  Cardiovascular: No lower extremity edema, non tender, no erythema  Skin: Warm dry intact with no signs of infection or rash on extremities or on axial skeleton.  Abdomen: Soft nontender  Neuro: Cranial nerves II through XII are intact, neurovascularly intact in all extremities with 2+ DTRs and 2+ pulses.  Lymph: No lymphadenopathy of posterior or anterior cervical chain or axillae bilaterally.  Gait normal with good balance and coordination.  MSK:  Non tender with full range of motion and good stability and symmetric strength and tone of  elbows, wrist, hip, knee and ankles bilaterally.  Shoulder: left Inspection reveals no abnormalities, atrophy or asymmetry. Palpation is normal with  no tenderness over AC joint or bicipital groove. ROM is full in all planes passively. Rotator cuff strength normal throughout. signs of impingement with positive Neer and Hawkin's tests, but negative empty can sign. Speeds and Yergason's tests normal. No labral pathology noted with negative Obrien's, negative clunk and good stability. Normal scapular function observed. No painful arc and no drop arm sign. No apprehension sign  MSK US performed of: left This study was ordered, performed,  and interpreted by Charlann Boxer D.O.  Shoulder:   Supraspinatus:  Appears normal on long and transverse views, Bursal bulge seen with shoulder abduction on impingement view. Infraspinatus:  Appears normal on long and transverse views. Significant increase in Doppler flow Subscapularis:  Appears normal on long and transverse views. Positive bursa Teres Minor:  Appears normal on long and transverse views. AC joint:  Capsule undistended, no geyser sign. Glenohumeral Joint:  Appears normal without effusion. Glenoid Labrum:  Intact without visualized tears. Biceps Tendon:  Appears normal on long and transverse views, no fraying of tendon, tendon located in intertubercular groove, no subluxation with shoulder internal or external rotation.  Impression: Subacromial bursitis  Procedure: Real-time Ultrasound Guided Injection of left glenohumeral joint Device: GE Logiq E  Ultrasound guided injection is preferred based studies that show increased duration, increased effect, greater accuracy, decreased procedural pain, increased response rate with ultrasound guided versus blind injection.  Verbal informed consent obtained.  Time-out conducted.  Noted no overlying erythema, induration, or other signs of local infection.  Skin prepped in a sterile fashion.  Local anesthesia: Topical Ethyl chloride.  With sterile technique and under real time ultrasound guidance:  Joint visualized.  23g 1  inch needle inserted posterior approach. Pictures taken for needle placement. Patient did have injection of 2 cc of 1% lidocaine, 2 cc of 0.5% Marcaine, and 1.0 cc of Kenalog 40 mg/dL. Completed without difficulty  Pain immediately resolved suggesting accurate placement of the medication.  Advised to call if fevers/chills, erythema, induration, drainage, or persistent bleeding.  Images permanently stored and available for review in the ultrasound unit.  Impression: Technically successful ultrasound guided  injection.  [id number]; 15 additional minutes spent for Therapeutic exercises as stated in above notes.  This included exercises focusing on stretching, strengthening, with significant focus on eccentric aspects.   Long term goals include an improvement in range of motion, strength, endurance as well as avoiding reinjury. Patient's frequency would include in 1-2 times a day, 3-5 times a week for a duration of 6-12 weeks. Shoulder Exercises that included:  Basic scapular stabilization to include adduction and depression of scapula Scaption, focusing on proper movement and good control Internal and External rotation utilizing a theraband, with elbow tucked at side entire time Rows with theraband given today   Proper technique shown and discussed handout in great detail with ATC.  All questions were discussed and answered.     Impression and Recommendations:     This case required medical decision making of moderate complexity.      Note: This dictation was prepared with Dragon dictation along with smaller phrase technology. Any transcriptional errors that result from this process are unintentional.

## 2017-07-24 ENCOUNTER — Ambulatory Visit: Payer: Self-pay

## 2017-07-24 ENCOUNTER — Encounter: Payer: Self-pay | Admitting: Family Medicine

## 2017-07-24 ENCOUNTER — Ambulatory Visit (INDEPENDENT_AMBULATORY_CARE_PROVIDER_SITE_OTHER): Payer: 59 | Admitting: Family Medicine

## 2017-07-24 VITALS — BP 122/88 | HR 77 | Ht 62.75 in | Wt 204.0 lb

## 2017-07-24 DIAGNOSIS — M25512 Pain in left shoulder: Secondary | ICD-10-CM | POA: Diagnosis not present

## 2017-07-24 DIAGNOSIS — M7552 Bursitis of left shoulder: Secondary | ICD-10-CM | POA: Diagnosis not present

## 2017-07-24 MED ORDER — DICLOFENAC SODIUM 2 % TD SOLN: 2.0000 "application " | Freq: Two times a day (BID) | TRANSDERMAL | 3 refills | Status: DC

## 2017-07-24 NOTE — Patient Instructions (Addendum)
nice to meet you  You do have a small tear of the rotator cuff but looks old  Exercises 3 times a week.  Ice 20 minutes 2 times daily. Usually after activity and before bed. pennsaid pinkie amount topically 2 times daily as needed.   Over the counter get Vitamin D 2000 IU daily  Turmeric 500mg  twice daily  See me again in 4 weeks.

## 2017-07-24 NOTE — Assessment & Plan Note (Signed)
Patient given injection today and tolerated the procedure well. We discussed icing regimen and home exercises. We discussed which activities to do a which ones to avoid. Patient will increase activity as tolerated. Patient follow-up with me again in 4 weeks. Topical anti-inflammatory's prescribed. Worsening symptoms we'll consider 90 glycerin and formal physical therapy.

## 2017-08-07 ENCOUNTER — Encounter: Payer: Self-pay | Admitting: Family Medicine

## 2017-08-14 ENCOUNTER — Ambulatory Visit (INDEPENDENT_AMBULATORY_CARE_PROVIDER_SITE_OTHER): Payer: 59 | Admitting: *Deleted

## 2017-08-14 DIAGNOSIS — Z23 Encounter for immunization: Secondary | ICD-10-CM

## 2017-08-15 ENCOUNTER — Encounter: Payer: Self-pay | Admitting: Family Medicine

## 2017-08-21 ENCOUNTER — Ambulatory Visit: Payer: Self-pay | Admitting: Family Medicine

## 2017-09-17 ENCOUNTER — Encounter: Payer: Self-pay | Admitting: Family Medicine

## 2017-09-18 ENCOUNTER — Other Ambulatory Visit: Payer: Self-pay | Admitting: *Deleted

## 2017-09-18 MED ORDER — ONDANSETRON HCL 4 MG PO TABS: 4.0000 mg | ORAL_TABLET | Freq: Three times a day (TID) | ORAL | 0 refills | Status: DC | PRN

## 2017-09-18 NOTE — Telephone Encounter (Signed)
Rx done-see Mychart message. 

## 2017-09-30 ENCOUNTER — Encounter: Payer: Self-pay | Admitting: Family Medicine

## 2017-09-30 ENCOUNTER — Ambulatory Visit: Payer: 59 | Admitting: Family Medicine

## 2017-09-30 VITALS — BP 110/80 | HR 78 | Temp 98.2°F | Wt 202.5 lb

## 2017-09-30 DIAGNOSIS — K582 Mixed irritable bowel syndrome: Secondary | ICD-10-CM | POA: Diagnosis not present

## 2017-09-30 DIAGNOSIS — R140 Abdominal distension (gaseous): Secondary | ICD-10-CM

## 2017-09-30 NOTE — Progress Notes (Signed)
Subjective:     Patient ID: Melissa Vasquez, female   DOB: Dec 10, 1970, 46 y.o.   MRN: [id number]  HPI Patient is here to discuss GI symptoms. She states for years she's had somewhat alternating loose stools with constipation. Symptoms seem to be slightly worse since she quit smoking about a month ago. She has not had any appetite changes. Had some slight weight gain since quitting smoking. No bloody stools. Frequent bloating. Occasional cramps but most bothersome for her alternating symptoms of loose stools and constipation. Symptoms history 4 days without bowel movement. She's tried over-the-counter probiotic without any improvement  Past Medical History:  Diagnosis Date  . HYPERLIPIDEMIA 03/16/2009  . HYPERTENSION 03/16/2009  . PANIC DISORDER 03/16/2009   Past Surgical History:  Procedure Laterality Date  . BUNIONECTOMY  1995   bilateral  . [place]    reports that she quit smoking about 8 years ago. Her smoking use included cigarettes. She has a 3.00 pack-year smoking history. she has never used smokeless tobacco. Her alcohol and drug histories are not on file. family history includes Alcohol abuse in her other; Diabetes in her other; Heart disease in her other; Hyperlipidemia in her other; Hypertension in her other; Mental illness in her other. No Known Allergies   Review of Systems  Constitutional: Negative for appetite change, chills and fever.  Cardiovascular: Negative for chest pain.  Gastrointestinal: Positive for constipation and diarrhea. Negative for blood in stool, nausea and vomiting.       Objective:   Physical Exam  Constitutional: She appears well-developed and well-nourished.  Cardiovascular: Normal rate and regular rhythm.  Pulmonary/Chest: Effort normal and breath sounds normal. No respiratory distress. She has no wheezes. She has no rales.  Abdominal: Soft. Bowel sounds are normal. She exhibits no distension and no mass. There is no tenderness.  There is no rebound and no guarding.       Assessment:     Probable IBS. She has alternating loose stools and constipation    Plan:     -We gave her handout on appropriate diet with foods to watch out for (FODMAPS) -Consider trial of over-the-counter psyllium fiber supplement -Follow-up immediately for any red flags such as bloody stools, fever, etc.  Eulas Post MD [facility] at [facility]

## 2017-09-30 NOTE — Patient Instructions (Signed)
Diet for Irritable Bowel Syndrome When you have irritable bowel syndrome (IBS), the foods you eat and your eating habits are very important. IBS may cause various symptoms, such as abdominal pain, constipation, or diarrhea. Choosing the right foods can help ease discomfort caused by these symptoms. Work with your health care provider and dietitian to find the best eating plan to help control your symptoms. What general guidelines do I need to follow?  Keep a food diary. This will help you identify foods that cause symptoms. Write down: ? What you eat and when. ? What symptoms you have. ? When symptoms occur in relation to your meals.  Avoid foods that cause symptoms. Talk with your dietitian about other ways to get the same nutrients that are in these foods.  Eat more foods that contain fiber. Take a fiber supplement if directed by your dietitian.  Eat your meals slowly, in a relaxed setting.  Aim to eat 5-6 small meals per day. Do not skip meals.  Drink enough fluids to keep your urine clear or pale yellow.  Ask your health care provider if you should take an over-the-counter probiotic during flare-ups to help restore healthy gut bacteria.  If you have cramping or diarrhea, try making your meals low in fat and high in carbohydrates. Examples of carbohydrates are pasta, rice, whole grain breads and cereals, fruits, and vegetables.  If dairy products cause your symptoms to flare up, try eating less of them. You might be able to handle yogurt better than other dairy products because it contains bacteria that help with digestion. What foods are not recommended? The following are some foods and drinks that may worsen your symptoms:  Fatty foods, such as French fries.  Milk products, such as cheese or ice cream.  Chocolate.  Alcohol.  Products with caffeine, such as coffee.  Carbonated drinks, such as soda.  The items listed above may not be a complete list of foods and beverages to  avoid. Contact your dietitian for more information. What foods are good sources of fiber? Your health care provider or dietitian may recommend that you eat more foods that contain fiber. Fiber can help reduce constipation and other IBS symptoms. Add foods with fiber to your diet a little at a time so that your body can get used to them. Too much fiber at once might cause gas and swelling of your abdomen. The following are some foods that are good sources of fiber:  Apples.  Peaches.  Pears.  Berries.  Figs.  Broccoli (raw).  Cabbage.  Carrots.  Raw peas.  Kidney beans.  Lima beans.  Whole grain bread.  Whole grain cereal.  Where to find more information: International Foundation for Functional Gastrointestinal Disorders: www.iffgd.org National Institute of Diabetes and Digestive and Kidney Diseases: www.niddk.nih.gov/health-information/health-topics/digestive-diseases/ibs/Pages/facts.aspx This information is not intended to replace advice given to you by your health care provider. Make sure you discuss any questions you have with your health care provider. Document Released: 01/25/2004 Document Revised: 04/11/2016 Document Reviewed: 02/04/2014 Elsevier Interactive Patient Education  2018 Elsevier Inc.  

## 2017-10-24 ENCOUNTER — Other Ambulatory Visit: Payer: Self-pay | Admitting: Family Medicine

## 2018-01-14 DIAGNOSIS — L409 Psoriasis, unspecified: Secondary | ICD-10-CM | POA: Insufficient documentation

## 2018-01-14 DIAGNOSIS — D229 Melanocytic nevi, unspecified: Secondary | ICD-10-CM | POA: Diagnosis not present

## 2018-04-15 ENCOUNTER — Other Ambulatory Visit: Payer: Self-pay | Admitting: Family Medicine

## 2018-07-08 DIAGNOSIS — R2242 Localized swelling, mass and lump, left lower limb: Secondary | ICD-10-CM | POA: Diagnosis not present

## 2018-07-08 DIAGNOSIS — I10 Essential (primary) hypertension: Secondary | ICD-10-CM | POA: Diagnosis not present

## 2018-07-08 DIAGNOSIS — M7989 Other specified soft tissue disorders: Secondary | ICD-10-CM | POA: Diagnosis not present

## 2018-07-10 ENCOUNTER — Encounter: Payer: Self-pay | Admitting: Family Medicine

## 2018-07-10 ENCOUNTER — Ambulatory Visit: Payer: 59 | Admitting: Family Medicine

## 2018-07-10 VITALS — BP 124/84 | HR 77 | Temp 98.3°F | Wt 210.2 lb

## 2018-07-10 DIAGNOSIS — M25562 Pain in left knee: Secondary | ICD-10-CM

## 2018-07-10 NOTE — Patient Instructions (Signed)
Try some icing 15 to 20 minutes 2-3 times daily  Avoid repetitive squatting  Let me know if not better in 2-3 weeks.

## 2018-07-10 NOTE — Progress Notes (Signed)
  Subjective:     Patient ID: Melissa Vasquez, female   DOB: 30-Oct-1971, 47 y.o.   MRN: [id number]  HPI Patient seen with left knee pain and some left lower extremity swelling. She states 8 days ago she went with her daughter down to [employer] to help her move in. She had room on the third floor and had several trips up and down stairs. She denies any injury. She noticed some pain and swelling in left lower extremity around the left knee later that day. She went to the ER 2 days ago and had venous Dopplers with no DVT. X-rays of the knee no acute abnormalities. Patient was told she could've had possible small Baker cyst that ruptured.  Denies any locking or giving way of the knee. She has slight edema left foot compared to the right. Denies any prior history of knee difficulties. She does have history of psoriasis but no significant psoriatic arthritis. Denies any other joint pains. Has not seen any warmth or erythema. She has a sister with rheumatoid arthritis.  Past Medical History:  Diagnosis Date  . HYPERLIPIDEMIA 03/16/2009  . HYPERTENSION 03/16/2009  . PANIC DISORDER 03/16/2009     reports that she quit smoking about 9 years ago. Her smoking use included cigarettes. She has a 3.00 pack-year smoking history. She has never used smokeless tobacco. Her alcohol and drug histories are not on file. family history includes Alcohol abuse in her other; Diabetes in her other; Heart disease in her other; Hyperlipidemia in her other; Hypertension in her other; Mental illness in her other. No Known Allergies   Review of Systems  Constitutional: Negative for chills and fever.  Respiratory: Negative for shortness of breath.   Cardiovascular: Negative for chest pain.  Neurological: Negative for weakness.       Objective:   Physical Exam  Constitutional: She appears well-developed and well-nourished.  Cardiovascular: Normal rate and regular rhythm.  Pulmonary/Chest: Effort normal and breath sounds normal.   Musculoskeletal:  Left knee reveals no effusion. No warmth. No erythema. Full range of motion. No localized tenderness. Ligament testing normal.no popliteal swelling  No pitting edema lower extremities.       Assessment:     Left knee pain. Unremarkable exam. Question small meniscus irritation/injury.    Plan:     -recommend icing 15-20 minutes 2-3 times daily. Avoid repetitive squatting. -Touch base if not improving over the next 2-3 weeks  Eulas Post MD [facility] at [facility]

## 2018-07-19 ENCOUNTER — Other Ambulatory Visit: Payer: Self-pay | Admitting: Family Medicine

## 2018-07-29 ENCOUNTER — Ambulatory Visit (INDEPENDENT_AMBULATORY_CARE_PROVIDER_SITE_OTHER): Payer: 59 | Admitting: Family Medicine

## 2018-07-29 ENCOUNTER — Encounter: Payer: Self-pay | Admitting: Family Medicine

## 2018-07-29 VITALS — BP 130/90 | HR 90 | Temp 98.0°F | Ht 62.5 in | Wt 206.8 lb

## 2018-07-29 DIAGNOSIS — Z23 Encounter for immunization: Secondary | ICD-10-CM | POA: Diagnosis not present

## 2018-07-29 DIAGNOSIS — Z0000 Encounter for general adult medical examination without abnormal findings: Secondary | ICD-10-CM

## 2018-07-29 LAB — LDL CHOLESTEROL, DIRECT: LDL DIRECT: 70 mg/dL

## 2018-07-29 LAB — CBC WITH DIFFERENTIAL/PLATELET
BASOS ABS: 0 10*3/uL (ref 0.0–0.1)
Basophils Relative: 0.5 % (ref 0.0–3.0)
EOS ABS: 0.1 10*3/uL (ref 0.0–0.7)
EOS PCT: 1.5 % (ref 0.0–5.0)
HCT: 42.6 % (ref 36.0–46.0)
HEMOGLOBIN: 14.8 g/dL (ref 12.0–15.0)
Lymphocytes Relative: 36.9 % (ref 12.0–46.0)
Lymphs Abs: 2.2 10*3/uL (ref 0.7–4.0)
MCHC: 34.7 g/dL (ref 30.0–36.0)
MCV: 85.7 fl (ref 78.0–100.0)
MONO ABS: 0.4 10*3/uL (ref 0.1–1.0)
Monocytes Relative: 6.6 % (ref 3.0–12.0)
NEUTROS PCT: 54.5 % (ref 43.0–77.0)
Neutro Abs: 3.3 10*3/uL (ref 1.4–7.7)
Platelets: 230 10*3/uL (ref 150.0–400.0)
RBC: 4.97 Mil/uL (ref 3.87–5.11)
RDW: 12.9 % (ref 11.5–15.5)
WBC: 6 10*3/uL (ref 4.0–10.5)

## 2018-07-29 LAB — BASIC METABOLIC PANEL
BUN: 15 mg/dL (ref 6–23)
CHLORIDE: 99 meq/L (ref 96–112)
CO2: 30 mEq/L (ref 19–32)
Calcium: 9.3 mg/dL (ref 8.4–10.5)
Creatinine, Ser: 0.59 mg/dL (ref 0.40–1.20)
GFR: 116.41 mL/min (ref >60.00)
Glucose, Bld: 184 mg/dL — ABNORMAL HIGH (ref 70–99)
POTASSIUM: 4.3 meq/L (ref 3.5–5.1)
SODIUM: 136 meq/L (ref 135–145)

## 2018-07-29 LAB — HEPATIC FUNCTION PANEL
ALK PHOS: 88 U/L (ref 39–117)
ALT: 45 U/L — ABNORMAL HIGH (ref 0–35)
AST: 35 U/L (ref 0–37)
Albumin: 4 g/dL (ref 3.5–5.2)
BILIRUBIN DIRECT: 0.1 mg/dL (ref 0.0–0.3)
BILIRUBIN TOTAL: 0.6 mg/dL (ref 0.2–1.2)
Total Protein: 6.7 g/dL (ref 6.0–8.3)

## 2018-07-29 LAB — LIPID PANEL
CHOL/HDL RATIO: 5
CHOLESTEROL: 189 mg/dL (ref 0–200)
HDL: 34.7 mg/dL — AB (ref >39.00)

## 2018-07-29 LAB — TSH: TSH: 4.67 u[IU]/mL — AB (ref 0.35–4.50)

## 2018-07-29 LAB — HEMOGLOBIN A1C: Hgb A1c MFr Bld: 8.3 % — ABNORMAL HIGH (ref 4.6–6.5)

## 2018-07-29 NOTE — Patient Instructions (Signed)

## 2018-07-29 NOTE — Progress Notes (Signed)
Subjective:     Patient ID: Melissa Vasquez, female   DOB: 10/17/1971, 47 y.o.   MRN: [id number]  HPI Patient here for physical exam. She sees GYN yearly for Pap smears and mammograms. She had some recent left knee pain and that is gradually improving. Her ankle edema has resolved. Dopplers negative for DVT.  Chronic problems include history of obesity, metabolic syndrome, mild elevated liver transaminases, dyslipidemia, hypertension, history of depression, and history of panic disorder. She remains on bisoprolol HCTZ. Not monitoring blood pressures at home.  History triglycerides over 600. A1c last year 6.2%. Her weight is up another 4-1/2 pounds from last year. She needs flu vaccine. Tetanus up-to-date  She's had previous endometrial ablation. She has occasional hot flashes. Abbott last year did not indicate postmenopausal range  Past Medical History:  Diagnosis Date  . HYPERLIPIDEMIA 03/16/2009  . HYPERTENSION 03/16/2009  . PANIC DISORDER 03/16/2009     reports that she quit smoking about 9 years ago. Her smoking use included cigarettes. She has a 3.00 pack-year smoking history. She has never used smokeless tobacco. Her alcohol and drug histories are not on file. family history includes Alcohol abuse in her other; Diabetes in her other; Heart disease in her other; Hyperlipidemia in her other; Hypertension in her other; Mental illness in her other. No Known Allergies   Review of Systems  Constitutional: Negative for activity change, appetite change, fatigue, fever and unexpected weight change.  HENT: Negative for ear pain, hearing loss, sore throat and trouble swallowing.   Eyes: Negative for visual disturbance.  Respiratory: Negative for cough and shortness of breath.   Cardiovascular: Negative for chest pain and palpitations.  Gastrointestinal: Negative for abdominal pain, blood in stool, constipation and diarrhea.  Endocrine: Negative for polydipsia and polyuria.  Genitourinary: Negative  for dysuria and hematuria.  Musculoskeletal: Negative for back pain and myalgias.  Skin: Negative for rash.  Neurological: Negative for dizziness, syncope and headaches.  Hematological: Negative for adenopathy.  Psychiatric/Behavioral: Negative for confusion and dysphoric mood.       Objective:   Physical Exam  Constitutional: She is oriented to person, place, and time. She appears well-developed and well-nourished.  HENT:  Head: Normocephalic and atraumatic.  Eyes: Pupils are equal, round, and reactive to light. EOM are normal.  Neck: Normal range of motion. Neck supple. No thyromegaly present.  Cardiovascular: Normal rate, regular rhythm and normal heart sounds.  No murmur heard. Pulmonary/Chest: Breath sounds normal. No respiratory distress. She has no wheezes. She has no rales.  Abdominal: Soft. Bowel sounds are normal. She exhibits no distension and no mass. There is no tenderness. There is no rebound and no guarding.  Genitourinary:  Genitourinary Comments: Per gyn   Musculoskeletal: Normal range of motion. She exhibits no edema.  Lymphadenopathy:    She has no cervical adenopathy.  Neurological: She is alert and oriented to person, place, and time. She displays normal reflexes. No cranial nerve deficit.  Skin: No rash noted.  Psychiatric: She has a normal mood and affect. Her behavior is normal. Judgment and thought content normal.       Assessment:     Physical exam. The following health maintenance issues were discussed.  She does have history of obesity and metabolic syndrome and very risk for type 2 diabetes    Plan:     -flu vaccine given -She is strongly encouraged to try to lose some weight and establish more consistent exercise -Obtain screening lab work.  With include A1c  with her history of mild hyperglycemia and family history diabetes -Discussed dietary management of high triglyceride with handout given  Eulas Post MD [facility] at  [facility]

## 2018-07-30 ENCOUNTER — Encounter: Payer: Self-pay | Admitting: *Deleted

## 2018-07-30 MED ORDER — METFORMIN HCL 500 MG PO TABS: 500.0000 mg | ORAL_TABLET | Freq: Two times a day (BID) | ORAL | 2 refills | Status: DC

## 2018-07-30 NOTE — Telephone Encounter (Signed)
Dr Wadie Lessen pts metformin has been sent in to the pharmacy for the pt and she is aware to come back in 2 month for recheck---she is also requesting recs about her TSH results.  Please advise. Thanks   Also, I noticed my TSH was elevated above normal. My sister has to take thyroid medicine and I wondered if he thought I should take that as well.

## 2018-08-03 ENCOUNTER — Encounter: Payer: Self-pay | Admitting: Family Medicine

## 2018-08-03 NOTE — Telephone Encounter (Signed)
Good morning.   Can Dr. Elease Hashimoto tell me how long the side effects of metformin usually last? The diarrhea is the worst part but I am also having nausea (no vomiting), gas, and most everything I try to eat tastes strange or unappealing. This morning I woke up with a headache which is also uncommon for me.   Thanks!   Dr Elease Hashimoto, please advise. Thanks

## 2018-08-16 ENCOUNTER — Other Ambulatory Visit: Payer: Self-pay | Admitting: Family Medicine

## 2018-08-20 ENCOUNTER — Encounter: Payer: Self-pay | Admitting: Family Medicine

## 2018-08-22 NOTE — Progress Notes (Signed)
[name] [address] [place], [place] [address] Phone: [phone] Subjective:     CC: Knee pain  BJY:[id number]  Melissa Vasquez is a 47 y.o. female coming in with complaint of left knee pain. Has a knot on the back of her knee that is TTP. Gets swelling in her ankle. Knee feels "puffy". Noticed the knot & swelling prior to knee pain. Has had xrays and doppler to rule out blood flow issues. Wearing a knee brace. Walks with a limp and pain radiates up the leg. No numbness and tingling noted. States that sometimes her toes are cold.   Onset- Chronic (2 months) Location- posterior left knee  Duration-  Character- hard knot  Aggravating factors- walking  Reliving factors-  Therapies tried- Ice, staying off of it  Severity-7 out of 10   July 08, 2018 patient did have a ultrasound that ruled out any deep venous thrombosis.  Past Medical History:  Diagnosis Date  . HYPERLIPIDEMIA 03/16/2009  . HYPERTENSION 03/16/2009  . PANIC DISORDER 03/16/2009   Past Surgical History:  Procedure Laterality Date  . BUNIONECTOMY  1995   bilateral  . WISDOM TOOTH EXTRACTION  1995   Social History   Socioeconomic History  . Marital status: Married    Spouse name: Not on file  . Number of children: Not on file  . Years of education: Not on file  . Highest education level: Not on file  Occupational History  . Not on file  Social Needs  . Financial resource strain: Not on file  . Food insecurity:    Worry: Not on file    Inability: Not on file  . Transportation needs:    Medical: Not on file    Non-medical: Not on file  Tobacco Use  . Smoking status: Former Smoker    Packs/day: 0.20    Years: 15.00    Pack years: 3.00    Types: Cigarettes    Last attempt to quit: 01/02/2009    Years since quitting: 9.6  . Smokeless tobacco: Never Used  Substance and Sexual Activity  . Alcohol use: Not on file  . Drug use: Not on file  . Sexual activity: Not on file    Lifestyle  . Physical activity:    Days per week: Not on file    Minutes per session: Not on file  . Stress: Not on file  Relationships  . Social connections:    Talks on phone: Not on file    Gets together: Not on file    Attends religious service: Not on file    Active member of club or organization: Not on file    Attends meetings of clubs or organizations: Not on file    Relationship status: Not on file  Other Topics Concern  . Not on file  Social History Narrative  . Not on file   No Known Allergies Family History  Problem Relation Age of Onset  . Alcohol abuse Other   . Hyperlipidemia Other   . Hypertension Other   . Mental illness Other   . Diabetes Other   . Heart disease Other     Current Outpatient Medications (Endocrine & Metabolic):  .  metFORMIN (GLUCOPHAGE) 500 MG tablet, Take 1 tablet (500 mg total) by mouth 2 (two) times daily with a meal.  Current Outpatient Medications (Cardiovascular):  .  bisoprolol-hydrochlorothiazide (ZIAC) 5-6.25 MG tablet, TAKE 1 TABLET BY MOUTH EVERY DAY  Current Outpatient Medications (Respiratory):  .  fluticasone (FLONASE) 50 MCG/ACT nasal spray, USE 2 SPRAYS IN EACH NOSTRIL ONCE DAILY AT NIGHT    Current Outpatient Medications (Other):  Marland Kitchen  Betamethasone Dipropionate (SERNIVO) 0.05 % EMUL, Apply topically. .  Clobetasol Propionate (CLOBEX SPRAY) 0.05 % external spray, Clobex 0.05 % topical spray  APPLY TO THE AFFECTED AREA(S) BY TOPICAL ROUTE 2 TIMES PER DAY ; RUB IN GENTLY AND COMPLETELY .  metroNIDAZOLE (METROGEL) 0.75 % vaginal gel,  .  minocycline (MINOCIN,DYNACIN) 100 MG capsule,     Past medical history, social, surgical and family history all reviewed in electronic medical record.  No pertanent information unless stated regarding to the chief complaint.   Review of Systems:  No headache, visual changes, nausea, vomiting, diarrhea, constipation, dizziness, abdominal pain, skin rash, fevers, chills, night sweats,  weight loss, swollen lymph nodes, body aches, joint swelling, muscle aches, chest pain, shortness of breath, mood changes.   Objective  Blood pressure 130/82, pulse 74, height 5' 2.5" (1.588 m), weight 206 lb (93.4 kg), SpO2 98 %.    General: No apparent distress alert and oriented x3 mood and affect normal, dressed appropriately.  HEENT: Pupils equal, extraocular movements intact  Respiratory: Patient's speak in full sentences and does not appear short of breath  Cardiovascular: No lower extremity edema, non tender, no erythema  Skin: Warm dry intact with no signs of infection or rash on extremities or on axial skeleton.  Abdomen: Soft nontender  Neuro: Cranial nerves II through XII are intact, neurovascularly intact in all extremities with 2+ DTRs and 2+ pulses.  Lymph: No lymphadenopathy of posterior or anterior cervical chain or axillae bilaterally.  Gait normal with good balance and coordination.  MSK:  Non tender with full range of motion and good stability and symmetric strength and tone of shoulders, elbows, wrist, hip, and ankles bilaterally.  Left knee exam does show fullness in the popliteal area.  Mostly on the medial aspect.  No lateral healing noted.  Fairly large and quite tight.  Lacks the last 5 degrees of extension.  Lacks last 10 degrees of flexion.  Mild arthritic changes of the medial compartment.  Procedure: Real-time Ultrasound Guided Injection of left Baker's cyst Device: GE Logiq Q7 Ultrasound guided injection is preferred based studies that show increased duration, increased effect, greater accuracy, decreased procedural pain, increased response rate, and decreased cost with ultrasound guided versus blind injection.  Verbal informed consent obtained.  Time-out conducted.  Noted no overlying erythema, induration, or other signs of local infection.  Skin prepped in a sterile fashion.  Local anesthesia: Topical Ethyl chloride.  With sterile technique and under real  time ultrasound guidance: With a 22-gauge 2 inch needle patient was injected with 4 cc of 0.5% Marcaine and aspirated 25 cc of straw-colored fluid then gel-like material removed, injected 1 cc of Kenalog 40 mg/dL. This was from a posterior approach.  Completed without difficulty  Pain immediately resolved suggesting accurate placement of the medication.  Advised to call if fevers/chills, erythema, induration, drainage, or persistent bleeding.  Images permanently stored and available for review in the ultrasound unit.  Impression: Technically successful ultrasound guided injection.    Impression and Recommendations:     This case required medical decision making of moderate complexity. The above documentation has been reviewed and is accurate and complete Lyndal Pulley, DO       Note: This dictation was prepared with Dragon dictation along with smaller phrase technology. Any transcriptional errors that result from this process are unintentional.

## 2018-08-24 ENCOUNTER — Ambulatory Visit: Payer: 59 | Admitting: Family Medicine

## 2018-08-24 ENCOUNTER — Ambulatory Visit: Payer: Self-pay

## 2018-08-24 ENCOUNTER — Encounter: Payer: Self-pay | Admitting: Family Medicine

## 2018-08-24 VITALS — BP 130/82 | HR 74 | Ht 62.5 in | Wt 206.0 lb

## 2018-08-24 DIAGNOSIS — G8929 Other chronic pain: Secondary | ICD-10-CM

## 2018-08-24 DIAGNOSIS — M25562 Pain in left knee: Secondary | ICD-10-CM

## 2018-08-24 DIAGNOSIS — M7122 Synovial cyst of popliteal space [Baker], left knee: Secondary | ICD-10-CM

## 2018-08-24 DIAGNOSIS — Z96652 Presence of left artificial knee joint: Secondary | ICD-10-CM

## 2018-08-24 NOTE — Assessment & Plan Note (Signed)
Patient given injection today with some mild aspiration.  I do believe that there is a possibility for a ganglion cyst.  Discussed with patient about postinjection pain and when to seek medical attention.  I did do believe the patient will do well.  Patient does have some close proximity to the main vessels of this Baker's cyst.  No increase in vascularity though that is still concerning.  We discussed icing regimen and home exercises.  Patient will follow-up with me again in 3 to 4 weeks after compression and topical anti-inflammatories.  May need further evaluation with advanced imaging if continuing to have difficulty

## 2018-08-24 NOTE — Patient Instructions (Addendum)
Good to see you  Ice is your friend Stay active Compression  of the knee would be good for next week then with activity always pennsaid pinkie amount topically 2 times daily as needed.  Send me a message tomorrow tell me how you are doing  See me again in 3 weeks

## 2018-08-25 ENCOUNTER — Encounter: Payer: Self-pay | Admitting: Family Medicine

## 2018-09-16 ENCOUNTER — Ambulatory Visit: Payer: 59 | Admitting: Family Medicine

## 2018-09-18 NOTE — Progress Notes (Signed)
[name] [place], [place] [address] Phone: [phone] Subjective:   Fontaine No, am serving as a scribe for Dr. Hulan Saas.   CC: Right knee pain left knee follow-up  KGS:[id number]  Melissa Vasquez is a 47 y.o. female coming in with complaint of right knee pain. Pain with knee flexion. There is a pocket of swelling on lateral joint line. Pain is chronic and increased pain in September. Going down stairs hurts more than upstairs.   Patient continues to have a lump in back of left knee. She does wear compression sleeve with activity. Pain on medial aspect of left knee.  Patient states that it seems to be improving overall.  States 60 to 70% better.  Aspiration at last follow-up      Past Medical History:  Diagnosis Date  . HYPERLIPIDEMIA 03/16/2009  . HYPERTENSION 03/16/2009  . PANIC DISORDER 03/16/2009   Past Surgical History:  Procedure Laterality Date  . BUNIONECTOMY  1995   bilateral  . WISDOM TOOTH EXTRACTION  1995   Social History   Socioeconomic History  . Marital status: Married    Spouse name: Not on file  . Number of children: Not on file  . Years of education: Not on file  . Highest education level: Not on file  Occupational History  . Not on file  Social Needs  . Financial resource strain: Not on file  . Food insecurity:    Worry: Not on file    Inability: Not on file  . Transportation needs:    Medical: Not on file    Non-medical: Not on file  Tobacco Use  . Smoking status: Former Smoker    Packs/day: 0.20    Years: 15.00    Pack years: 3.00    Types: Cigarettes    Last attempt to quit: 01/02/2009    Years since quitting: 9.7  . Smokeless tobacco: Never Used  Substance and Sexual Activity  . Alcohol use: Not on file  . Drug use: Not on file  . Sexual activity: Not on file  Lifestyle  . Physical activity:    Days per week: Not on file    Minutes per session: Not on file  . Stress: Not on file    Relationships  . Social connections:    Talks on phone: Not on file    Gets together: Not on file    Attends religious service: Not on file    Active member of club or organization: Not on file    Attends meetings of clubs or organizations: Not on file    Relationship status: Not on file  Other Topics Concern  . Not on file  Social History Narrative  . Not on file   No Known Allergies Family History  Problem Relation Age of Onset  . Alcohol abuse Other   . Hyperlipidemia Other   . Hypertension Other   . Mental illness Other   . Diabetes Other   . Heart disease Other     Current Outpatient Medications (Endocrine & Metabolic):  .  metFORMIN (GLUCOPHAGE) 500 MG tablet, Take 1 tablet (500 mg total) by mouth 2 (two) times daily with a meal.  Current Outpatient Medications (Cardiovascular):  .  bisoprolol-hydrochlorothiazide (ZIAC) 5-6.25 MG tablet, TAKE 1 TABLET BY MOUTH EVERY DAY  Current Outpatient Medications (Respiratory):  .  fluticasone (FLONASE) 50 MCG/ACT nasal spray, USE 2 SPRAYS IN EACH NOSTRIL ONCE DAILY AT NIGHT  Current Outpatient Medications (Other):  Marland Kitchen  Betamethasone Dipropionate (SERNIVO) 0.05 % EMUL, Apply topically. .  Clobetasol Propionate (CLOBEX SPRAY) 0.05 % external spray, Clobex 0.05 % topical spray  APPLY TO THE AFFECTED AREA(S) BY TOPICAL ROUTE 2 TIMES PER DAY ; RUB IN GENTLY AND COMPLETELY .  metroNIDAZOLE (METROGEL) 0.75 % vaginal gel,  .  minocycline (MINOCIN,DYNACIN) 100 MG capsule,     Past medical history, social, surgical and family history all reviewed in electronic medical record.  No pertanent information unless stated regarding to the chief complaint.   Review of Systems:  No headache, visual changes, nausea, vomiting, diarrhea, constipation, dizziness, abdominal pain, skin rash, fevers, chills, night sweats, weight loss, swollen lymph nodes, body aches,chest pain, shortness of breath, mood changes.  Positive muscle aches and joint  swelling  Objective  Blood pressure (!) 128/98, pulse 71, height 5' 2.5" (1.588 m), weight 206 lb (93.4 kg), SpO2 98 %.     General: No apparent distress alert and oriented x3 mood and affect normal, dressed appropriately.  HEENT: Pupils equal, extraocular movements intact  Respiratory: Patient's speak in full sentences and does not appear short of breath  Cardiovascular: No lower extremity edema, non tender, no erythema  Skin: Warm dry intact with no signs of infection or rash on extremities or on axial skeleton.  Abdomen: Soft nontender  Neuro: Cranial nerves II through XII are intact, neurovascularly intact in all extremities with 2+ DTRs and 2+ pulses.  Lymph: No lymphadenopathy of posterior or anterior cervical chain or axillae bilaterally.  Gait normal with good balance and coordination.  MSK:  Non tender with full range of motion and good stability and symmetric strength and tone of shoulders, elbows, wrist, hip and ankles bilaterally.  Right knee exam shows the patient does have some swelling on the anterior lateral aspect of the knee.  Lacks last 5 degrees of flexion.  Full extension though noted.  Mild crepitus with the patella joint.   Left knee still has fullness in the popliteal area.  Lacks last 5 degrees of extension.  Otherwise fairly unremarkable.  Procedure: Real-time Ultrasound Guided Injection of right knee Device: GE Logiq Q7 Ultrasound guided injection is preferred based studies that show increased duration, increased effect, greater accuracy, decreased procedural pain, increased response rate, and decreased cost with ultrasound guided versus blind injection.  Verbal informed consent obtained.  Time-out conducted.  Noted no overlying erythema, induration, or other signs of local infection.  Skin prepped in a sterile fashion.  Local anesthesia: Topical Ethyl chloride.  With sterile technique and under real time ultrasound guidance: With a 22-gauge 2 inch needle  patient was injected with 4 cc of 0.5% Marcaine and attempted aspiration of gel-like material but very minimal removed and injected 1 cc of Kenalog 40 mg/dL. This was from a anterior approach.  Completed without difficulty  Pain immediately resolved suggesting accurate placement of the medication.  Advised to call if fevers/chills, erythema, induration, drainage, or persistent bleeding.  Images permanently stored and available for review in the ultrasound unit.  Impression: Technically successful ultrasound guided injection.   Impression and Recommendations:     This case required medical decision making of moderate complexity. The above documentation has been reviewed and is accurate and complete Lyndal Pulley, DO       Note: This dictation was prepared with Dragon dictation along with smaller phrase technology. Any transcriptional errors that result from this process are unintentional.

## 2018-09-21 ENCOUNTER — Ambulatory Visit: Payer: 59 | Admitting: Family Medicine

## 2018-09-21 ENCOUNTER — Ambulatory Visit: Payer: Self-pay

## 2018-09-21 ENCOUNTER — Encounter: Payer: Self-pay | Admitting: Family Medicine

## 2018-09-21 VITALS — BP 128/98 | HR 71 | Ht 62.5 in | Wt 206.0 lb

## 2018-09-21 DIAGNOSIS — M25561 Pain in right knee: Secondary | ICD-10-CM

## 2018-09-21 DIAGNOSIS — M23041 Cystic meniscus, anterior horn of lateral meniscus, right knee: Secondary | ICD-10-CM | POA: Insufficient documentation

## 2018-09-21 DIAGNOSIS — G8929 Other chronic pain: Secondary | ICD-10-CM

## 2018-09-21 DIAGNOSIS — M7122 Synovial cyst of popliteal space [Baker], left knee: Secondary | ICD-10-CM | POA: Diagnosis not present

## 2018-09-21 NOTE — Patient Instructions (Signed)
Good to see you  Melissa Vasquez is your friend May need another compression sleeve on the other side Perimensical cyst  See me again in 6 weeks

## 2018-09-21 NOTE — Assessment & Plan Note (Signed)
Aspiration attempted today.  Significant swelling no noted.  No abnormal vascularity noted on ultrasound.  Patient has no fever, chills, any abnormal weight loss.  Could be a peri-meniscal cyst.  We will monitor.  Follow-up again in 4 to 6 weeks

## 2018-09-21 NOTE — Assessment & Plan Note (Signed)
Stable.  Improved after the injection.  Likely will need to have it done again.  Will reevaluate in 6 weeks.

## 2018-09-30 ENCOUNTER — Ambulatory Visit: Payer: 59 | Admitting: Family Medicine

## 2018-09-30 ENCOUNTER — Other Ambulatory Visit: Payer: Self-pay

## 2018-09-30 ENCOUNTER — Encounter: Payer: Self-pay | Admitting: Family Medicine

## 2018-09-30 VITALS — BP 130/80 | HR 80 | Temp 98.0°F | Ht 62.5 in | Wt 198.6 lb

## 2018-09-30 DIAGNOSIS — I10 Essential (primary) hypertension: Secondary | ICD-10-CM | POA: Diagnosis not present

## 2018-09-30 DIAGNOSIS — E1165 Type 2 diabetes mellitus with hyperglycemia: Secondary | ICD-10-CM

## 2018-09-30 DIAGNOSIS — R7989 Other specified abnormal findings of blood chemistry: Secondary | ICD-10-CM | POA: Diagnosis not present

## 2018-09-30 DIAGNOSIS — E8881 Metabolic syndrome: Secondary | ICD-10-CM

## 2018-09-30 DIAGNOSIS — E785 Hyperlipidemia, unspecified: Secondary | ICD-10-CM

## 2018-09-30 LAB — T4, FREE: FREE T4: 0.75 ng/dL (ref 0.60–1.60)

## 2018-09-30 LAB — TSH: TSH: 3.43 u[IU]/mL (ref 0.35–4.50)

## 2018-09-30 LAB — HEMOGLOBIN A1C: HEMOGLOBIN A1C: 6.7 % — AB (ref 4.6–6.5)

## 2018-09-30 NOTE — Patient Instructions (Signed)

## 2018-09-30 NOTE — Progress Notes (Signed)
  Subjective:     Patient ID: Melissa Vasquez, female   DOB: [birth year], 47 y.o.   MRN: [id number]  HPI Patient here for medical follow-up.  She had recent lab abnormalities including fasting glucose 184, A1c 8.3%, TSH 4.67, triglycerides 626.  We started metformin 500 mg twice daily.  She had some initial nausea but those symptoms have now resolved.  She has lost 8 pounds.  She is walking 2 to 3 miles per day.  She has made some dietary changes.  Feels better overall.  No polyuria or polydipsia.  No family history of hypothyroidism.  Denies any cold intolerance.  No consistent constipation issues.  Past Medical History:  Diagnosis Date  . HYPERLIPIDEMIA 03/16/2009  . HYPERTENSION 03/16/2009  . PANIC DISORDER 03/16/2009   Past Surgical History:  Procedure Laterality Date  . BUNIONECTOMY  1995   bilateral  . [place]    reports that she quit smoking about 9 years ago. Her smoking use included cigarettes. She has a 3.00 pack-year smoking history. She has never used smokeless tobacco. She reports that she drank alcohol. She reports that she does not use drugs. family history includes Alcohol abuse in her other; Diabetes in her other; Heart disease in her other; Hyperlipidemia in her other; Hypertension in her other; Mental illness in her other. No Known Allergies   Review of Systems  Constitutional: Negative for fatigue.  Eyes: Negative for visual disturbance.  Respiratory: Negative for cough, chest tightness, shortness of breath and wheezing.   Cardiovascular: Negative for chest pain, palpitations and leg swelling.  Neurological: Negative for dizziness, seizures, syncope, weakness, light-headedness and headaches.       Objective:   Physical Exam  Constitutional: She appears well-developed and well-nourished.  Eyes: Pupils are equal, round, and reactive to light.  Neck: Neck supple. No JVD present. No thyromegaly present.  Cardiovascular: Normal rate and regular  rhythm. Exam reveals no gallop.  Pulmonary/Chest: Effort normal and breath sounds normal. No respiratory distress. She has no wheezes. She has no rales.  Musculoskeletal: She exhibits no edema.  Neurological: She is alert.       Assessment:     #1 type 2 diabetes recently diagnosed.  She has made some very positive lifestyle changes and lost 8 pounds  #2 recent elevated TSH  #3 hypertriglyceridemia probably exacerbated by #1 and possibly #2  #4 metabolic syndrome.    Plan:     -continue exercise and weight loss efforts -Recheck labs today with A1c, TSH, free T4 -Continue metformin for now but her goal is to try to taper off this as her blood sugar control improves -Plan routine follow-up in 3 months.  Consider follow-up fasting lipids then.  Also establish baseline urine microalbumin.  She is instructed to set up eye exam at some point this year -discussed significance of metabolic syndrome and importance of lifestyle modification.  Eulas Post MD [facility]

## 2018-10-14 ENCOUNTER — Other Ambulatory Visit: Payer: Self-pay | Admitting: Family Medicine

## 2018-11-03 ENCOUNTER — Ambulatory Visit: Payer: 59 | Admitting: Family Medicine

## 2018-11-18 ENCOUNTER — Other Ambulatory Visit: Payer: Self-pay | Admitting: Family Medicine

## 2018-12-02 ENCOUNTER — Encounter: Payer: Self-pay | Admitting: Family Medicine

## 2018-12-10 ENCOUNTER — Ambulatory Visit: Payer: 59 | Admitting: Family Medicine

## 2018-12-10 ENCOUNTER — Encounter: Payer: Self-pay | Admitting: Family Medicine

## 2018-12-10 ENCOUNTER — Ambulatory Visit: Payer: Self-pay

## 2018-12-10 VITALS — BP 128/82 | HR 82 | Ht 62.5 in | Wt 198.0 lb

## 2018-12-10 DIAGNOSIS — M25511 Pain in right shoulder: Secondary | ICD-10-CM | POA: Diagnosis not present

## 2018-12-10 DIAGNOSIS — M7551 Bursitis of right shoulder: Secondary | ICD-10-CM | POA: Diagnosis not present

## 2018-12-10 MED ORDER — VITAMIN D (ERGOCALCIFEROL) 1.25 MG (50000 UNIT) PO CAPS: 50000.0000 [IU] | ORAL_CAPSULE | ORAL | 0 refills | Status: DC

## 2018-12-10 NOTE — Assessment & Plan Note (Signed)
Patient given injection.  Discussed topical anti-inflammatories, discussed which activities to do which wants to avoid.  This is a new problem but very similar to the contralateral side.  We will continue to monitor closely.  Follow-up again in 4 to 6 weeks

## 2018-12-10 NOTE — Patient Instructions (Addendum)
Good to see you  Injected the RIGHT shoulder today  Keep hands within peripheral vision  Exercises 3 times a week.  pennsaid pinkie amount topically 2 times daily as needed.  Once weekly vitamin D for 12 weeks See me again in 6ish week

## 2018-12-10 NOTE — Progress Notes (Signed)
[name] [place], [place] [address] Phone: [phone] Subjective:   Melissa Vasquez, am serving as a scribe for Dr. Hulan Saas.   CC: Right shoulder pain  OAC:[id number]  Melissa Vasquez is a 48 y.o. female coming in with complaint of right shoulder pain for 3 weeks. Pain over the [facility] joint and supraspinatus. Pain radiates down into her upper arm. Notes a stiffness in the cervical spine. Patient is unsure of mechanism of injury but she does have a big dog that pulls on her arm daily. Pain with flexion. Has used IBU occasionally. Is unable to sleep well due to pain.      Past Medical History:  Diagnosis Date  . HYPERLIPIDEMIA 03/16/2009  . HYPERTENSION 03/16/2009  . PANIC DISORDER 03/16/2009   Past Surgical History:  Procedure Laterality Date  . BUNIONECTOMY  1995   bilateral  . WISDOM TOOTH EXTRACTION  1995   Social History   Socioeconomic History  . Marital status: Married    Spouse name: Not on file  . Number of children: Not on file  . Years of education: Not on file  . Highest education level: Not on file  Occupational History  . Not on file  Social Needs  . Financial resource strain: Not on file  . Food insecurity:    Worry: Not on file    Inability: Not on file  . Transportation needs:    Medical: Not on file    Non-medical: Not on file  Tobacco Use  . Smoking status: Former Smoker    Packs/day: 0.20    Years: 15.00    Pack years: 3.00    Types: Cigarettes    Last attempt to quit: 01/02/2009    Years since quitting: 9.9  . Smokeless tobacco: Never Used  Substance and Sexual Activity  . Alcohol use: Not Currently  . Drug use: Never  . Sexual activity: Yes  Lifestyle  . Physical activity:    Days per week: Not on file    Minutes per session: Not on file  . Stress: Not on file  Relationships  . Social connections:    Talks on phone: Not on file    Gets together: Not on file    Attends religious service: Not  on file    Active member of club or organization: Not on file    Attends meetings of clubs or organizations: Not on file    Relationship status: Not on file  Other Topics Concern  . Not on file  Social History Narrative  . Not on file   Vasquez Known Allergies Family History  Problem Relation Age of Onset  . Alcohol abuse Other   . Hyperlipidemia Other   . Hypertension Other   . Mental illness Other   . Diabetes Other   . Heart disease Other     Current Outpatient Medications (Endocrine & Metabolic):  .  metFORMIN (GLUCOPHAGE) 500 MG tablet, TAKE 1 TABLET (500 MG TOTAL) BY MOUTH 2 (TWO) TIMES DAILY WITH A MEAL.  Current Outpatient Medications (Cardiovascular):  .  bisoprolol-hydrochlorothiazide (ZIAC) 5-6.25 MG tablet, TAKE 1 TABLET BY MOUTH EVERY DAY  Current Outpatient Medications (Respiratory):  .  fluticasone (FLONASE) 50 MCG/ACT nasal spray, USE 2 SPRAYS IN EACH NOSTRIL ONCE DAILY AT NIGHT    Current Outpatient Medications (Other):  Marland Kitchen  Betamethasone Dipropionate (SERNIVO) 0.05 % EMUL, Apply topically. .  Clobetasol Propionate (CLOBEX SPRAY) 0.05 % external spray, Clobex 0.05 %  topical spray  APPLY TO THE AFFECTED AREA(S) BY TOPICAL ROUTE 2 TIMES PER DAY ; RUB IN GENTLY AND COMPLETELY .  metroNIDAZOLE (METROGEL) 0.75 % vaginal gel,  .  minocycline (MINOCIN,DYNACIN) 100 MG capsule,  .  Vitamin D, Ergocalciferol, (DRISDOL) 1.25 MG (50000 UT) CAPS capsule, Take 1 capsule (50,000 Units total) by mouth every 7 (seven) days.    Past medical history, social, surgical and family history all reviewed in electronic medical record.  Vasquez pertanent information unless stated regarding to the chief complaint.   Review of Systems:  Vasquez headache, visual changes, nausea, vomiting, diarrhea, constipation, dizziness, abdominal pain, skin rash, fevers, chills, night sweats, weight loss, swollen lymph nodes, body aches, joint swelling, muscle aches, chest pain, shortness of breath, mood changes.    Objective  Blood pressure 128/82, pulse 82, height 5' 2.5" (1.588 m), weight 198 lb (89.8 kg), SpO2 98 %.    General: Vasquez apparent distress alert and oriented x3 mood and affect normal, dressed appropriately.  HEENT: Pupils equal, extraocular movements intact  Respiratory: Patient's speak in full sentences and does not appear short of breath  Cardiovascular: Vasquez lower extremity edema, non tender, Vasquez erythema  Skin: Warm dry intact with Vasquez signs of infection or rash on extremities or on axial skeleton.  Abdomen: Soft nontender  Neuro: Cranial nerves II through XII are intact, neurovascularly intact in all extremities with 2+ DTRs and 2+ pulses.  Lymph: Vasquez lymphadenopathy of posterior or anterior cervical chain or axillae bilaterally.  Gait normal with good balance and coordination.  MSK:  Non tender with full range of motion and good stability and symmetric strength and tone of  elbows, wrist, hip, knee and ankles bilaterally.  Shoulder: Right Inspection reveals Vasquez abnormalities, atrophy or asymmetry. Palpation is normal with Vasquez tenderness over AC joint or bicipital groove. ROM is full in all planes passively. Rotator cuff strength normal throughout. signs of impingement with positive Neer and Hawkin's tests, but negative empty can sign. Speeds and Yergason's tests normal. Vasquez labral pathology noted with negative Obrien's, negative clunk and good stability. Normal scapular function observed. Vasquez painful arc and Vasquez drop arm sign. Vasquez apprehension sign  MSK US performed of: Right This study was ordered, performed, and interpreted by Charlann Boxer D.O.  Shoulder:   Supraspinatus:  Appears normal on long and transverse views, Bursal bulge seen with shoulder abduction on impingement view. Infraspinatus:  Appears normal on long and transverse views. Significant increase in Doppler flow Subscapularis:  Appears normal on long and transverse views. Positive bursa Teres Minor:  Appears normal on  long and transverse views. AC joint:  Capsule undistended, Vasquez geyser sign. Glenohumeral Joint:  Appears normal without effusion. Glenoid Labrum:  Intact without visualized tears. Biceps Tendon:  Appears normal on long and transverse views, Vasquez fraying of tendon, tendon located in intertubercular groove, Vasquez subluxation with shoulder internal or external rotation.  Impression: Subacromial bursitis  Procedure: Real-time Ultrasound Guided Injection of right glenohumeral joint Device: GE Logiq E  Ultrasound guided injection is preferred based studies that show increased duration, increased effect, greater accuracy, decreased procedural pain, increased response rate with ultrasound guided versus blind injection.  Verbal informed consent obtained.  Time-out conducted.  Noted Vasquez overlying erythema, induration, or other signs of local infection.  Skin prepped in a sterile fashion.  Local anesthesia: Topical Ethyl chloride.  With sterile technique and under real time ultrasound guidance:  Joint visualized.  23g 1  inch needle inserted posterior approach. Pictures taken for  needle placement. Patient did have injection of 2 cc of 1% lidocaine, 2 cc of 0.5% Marcaine, and 1.0 cc of Kenalog 40 mg/dL. Completed without difficulty  Pain immediately resolved suggesting accurate placement of the medication.  Advised to call if fevers/chills, erythema, induration, drainage, or persistent bleeding.  Images permanently stored and available for review in the ultrasound unit.  Impression: Technically successful ultrasound guided injection.   Impression and Recommendations:     This case required medical decision making of moderate complexity. The above documentation has been reviewed and is accurate and complete Lyndal Pulley, DO       Note: This dictation was prepared with Dragon dictation along with smaller phrase technology. Any transcriptional errors that result from this process are unintentional.

## 2019-01-20 DIAGNOSIS — Z124 Encounter for screening for malignant neoplasm of cervix: Secondary | ICD-10-CM | POA: Diagnosis not present

## 2019-01-20 DIAGNOSIS — Z1231 Encounter for screening mammogram for malignant neoplasm of breast: Secondary | ICD-10-CM | POA: Diagnosis not present

## 2019-01-20 DIAGNOSIS — Z01419 Encounter for gynecological examination (general) (routine) without abnormal findings: Secondary | ICD-10-CM | POA: Diagnosis not present

## 2019-02-02 ENCOUNTER — Encounter: Payer: Self-pay | Admitting: Family Medicine

## 2019-02-03 ENCOUNTER — Other Ambulatory Visit: Payer: Self-pay

## 2019-02-03 MED ORDER — FLUOXETINE HCL 20 MG PO TABS: 20.0000 mg | ORAL_TABLET | Freq: Every day | ORAL | 2 refills | Status: DC

## 2019-02-11 ENCOUNTER — Encounter: Payer: Self-pay | Admitting: Family Medicine

## 2019-02-14 ENCOUNTER — Other Ambulatory Visit: Payer: Self-pay | Admitting: Family Medicine

## 2019-02-16 ENCOUNTER — Telehealth: Payer: Self-pay

## 2019-02-16 ENCOUNTER — Ambulatory Visit (INDEPENDENT_AMBULATORY_CARE_PROVIDER_SITE_OTHER): Payer: 59 | Admitting: Family Medicine

## 2019-02-16 ENCOUNTER — Other Ambulatory Visit: Payer: Self-pay

## 2019-02-16 DIAGNOSIS — E785 Hyperlipidemia, unspecified: Secondary | ICD-10-CM

## 2019-02-16 DIAGNOSIS — I10 Essential (primary) hypertension: Secondary | ICD-10-CM | POA: Diagnosis not present

## 2019-02-16 DIAGNOSIS — E1165 Type 2 diabetes mellitus with hyperglycemia: Secondary | ICD-10-CM | POA: Diagnosis not present

## 2019-02-16 MED ORDER — FLUOXETINE HCL 20 MG PO TABS: 20.0000 mg | ORAL_TABLET | Freq: Every day | ORAL | 3 refills | Status: DC

## 2019-02-16 MED ORDER — METFORMIN HCL 500 MG PO TABS: 500.0000 mg | ORAL_TABLET | Freq: Two times a day (BID) | ORAL | 3 refills | Status: DC

## 2019-02-16 MED ORDER — BISOPROLOL-HYDROCHLOROTHIAZIDE 5-6.25 MG PO TABS: 1.0000 | ORAL_TABLET | Freq: Every day | ORAL | 3 refills | Status: DC

## 2019-02-16 NOTE — Telephone Encounter (Signed)
Called patient to let her know that she is past due for her follow up with Dr. Elease Hashimoto and offered to do a WebEx visit. Please schedule for patient.  OK for PEC to discuss and schedule follow up appointment for WebEx if possible through her phone or computer.  CRM Created.

## 2019-02-16 NOTE — Progress Notes (Signed)
Patient ID: Melissa Vasquez, female   DOB: [birth year], 48 y.o.   MRN: [id number]  Virtual Visit via Video Note  I connected with Adabelle Paschen on 02/16/19 at  3:30 PM EDT by a video enabled telemedicine application and verified that I am speaking with the correct person using two identifiers.  Location patient: home Location provider:work or home office Persons participating in the virtual visit: patient, provider  I discussed the limitations of evaluation and management by telemedicine and the availability of in person appointments. The patient expressed understanding and agreed to proceed.   HPI:  Encounter with patient video enabled telemedicine to discuss the following  Type 2 diabetes.  Patient went on metformin several months ago.  Her A1c went from 8.3% to 6.7%.  Not monitoring blood sugars regularly.  No polyuria or polydipsia.  Needs refills of metformin  Hypertension.  Treated with Ziac.  She has lost some weight and current weight is down 186 pounds.  She attributes some of this to stress from recent pandemic crisis.  She states her blood pressures been stable.  History of panic disorder and chronic anxiety.  Her anxiety has been increased with current COVID-19 pandemic.  She recently went back on Prozac and is only been on this for about a week now.  Requesting refills  ROS: See pertinent positives and negatives per HPI.  Past Medical History:  Diagnosis Date  . HYPERLIPIDEMIA 03/16/2009  . HYPERTENSION 03/16/2009  . PANIC DISORDER 03/16/2009    Past Surgical History:  Procedure Laterality Date  . BUNIONECTOMY  1995   bilateral  . WISDOM TOOTH EXTRACTION  1995    Family History  Problem Relation Age of Onset  . Alcohol abuse Other   . Hyperlipidemia Other   . Hypertension Other   . Mental illness Other   . Diabetes Other   . Heart disease Other     SOCIAL HX: Non-smoker.  No regular alcohol use.  No illicit drug use history.   Current Outpatient Medications:  .   Betamethasone Dipropionate (SERNIVO) 0.05 % EMUL, Apply topically., Disp: , Rfl:  .  bisoprolol-hydrochlorothiazide (ZIAC) 5-6.25 MG tablet, Take 1 tablet by mouth daily., Disp: 90 tablet, Rfl: 3 .  Clobetasol Propionate (CLOBEX SPRAY) 0.05 % external spray, Clobex 0.05 % topical spray  APPLY TO THE AFFECTED AREA(S) BY TOPICAL ROUTE 2 TIMES PER DAY ; RUB IN GENTLY AND COMPLETELY, Disp: , Rfl:  .  FLUoxetine (PROZAC) 20 MG tablet, Take 1 tablet (20 mg total) by mouth daily., Disp: 90 tablet, Rfl: 3 .  fluticasone (FLONASE) 50 MCG/ACT nasal spray, USE 2 SPRAYS IN EACH NOSTRIL ONCE DAILY AT NIGHT, Disp: , Rfl: 0 .  metFORMIN (GLUCOPHAGE) 500 MG tablet, Take 1 tablet (500 mg total) by mouth 2 (two) times daily with a meal., Disp: 180 tablet, Rfl: 3 .  metroNIDAZOLE (METROGEL) 0.75 % vaginal gel, , Disp: , Rfl:  .  minocycline (MINOCIN,DYNACIN) 100 MG capsule, , Disp: , Rfl:  .  Vitamin D, Ergocalciferol, (DRISDOL) 1.25 MG (50000 UT) CAPS capsule, Take 1 capsule (50,000 Units total) by mouth every 7 (seven) days., Disp: 12 capsule, Rfl: 0  EXAM:  VITALS per patient if applicable:  GENERAL: alert, oriented, appears well and in no acute distress  HEENT: atraumatic, conjunttiva clear, no obvious abnormalities on inspection of external nose and ears  NECK: normal movements of the head and neck  LUNGS: on inspection no signs of respiratory distress, breathing rate appears normal, no obvious gross  SOB, gasping or wheezing  CV: no obvious cyanosis  MS: moves all visible extremities without noticeable abnormality  PSYCH/NEURO: pleasant and cooperative, no obvious depression or anxiety, speech and thought processing grossly intact  ASSESSMENT AND PLAN:  Discussed the following assessment and plan:  #1 type 2 diabetes.  Well controlled by most recent A1c 6.7%. -Refilled metformin for 1 year. -Patient needs follow-up A1c but will wait until current crisis has subsided and hopefully get her back in  in a few months for recheck  #2 history of chronic anxiety and panic disorder -Recently started back on Prozac 20 mg daily and tolerating well.  Refills given for 1 year -Discussed nonpharmacologic ways of trying to help her current stress- for example, plenty of walking and exercise  #3 hypertension.  This has been stable. -Refill bisoprolol for 1 year -Continue weight loss efforts and regular exercise with walking     I discussed the assessment and treatment plan with the patient. The patient was provided an opportunity to ask questions and all were answered. The patient agreed with the plan and demonstrated an understanding of the instructions.   The patient was advised to call back or seek an in-person evaluation if the symptoms worsen or if the condition fails to improve as anticipated.   Carolann Littler, MD

## 2019-02-16 NOTE — Telephone Encounter (Signed)
Called patient and left a voice message to let her know that she is past due for her follow up and to please call us and schedule a WebEx appointment for her follow up appointment.

## 2019-02-25 ENCOUNTER — Other Ambulatory Visit: Payer: Self-pay

## 2019-02-25 ENCOUNTER — Encounter: Payer: Self-pay | Admitting: Family Medicine

## 2019-02-25 MED ORDER — FLUOXETINE HCL 20 MG PO CAPS: 20.0000 mg | ORAL_CAPSULE | Freq: Every day | ORAL | 3 refills | Status: DC

## 2019-03-08 ENCOUNTER — Other Ambulatory Visit: Payer: Self-pay | Admitting: Family Medicine

## 2019-03-08 NOTE — Telephone Encounter (Signed)
Left message for patient to schedule a virtual visit for refill Rx.

## 2019-03-09 NOTE — Telephone Encounter (Signed)
Left message to call back for virtual visit.  

## 2019-03-10 NOTE — Telephone Encounter (Signed)
Left message for patient to schedule virtual visit for rx refill.

## 2019-09-09 ENCOUNTER — Other Ambulatory Visit: Payer: Self-pay

## 2019-09-09 DIAGNOSIS — Z20822 Contact with and (suspected) exposure to covid-19: Secondary | ICD-10-CM

## 2019-09-11 LAB — NOVEL CORONAVIRUS, NAA: SARS-CoV-2, NAA: NOT DETECTED

## 2019-09-13 ENCOUNTER — Encounter: Payer: Self-pay | Admitting: Family Medicine

## 2019-09-14 ENCOUNTER — Encounter: Payer: Self-pay | Admitting: Family Medicine

## 2019-09-14 ENCOUNTER — Other Ambulatory Visit: Payer: Self-pay

## 2019-09-14 ENCOUNTER — Ambulatory Visit (INDEPENDENT_AMBULATORY_CARE_PROVIDER_SITE_OTHER): Payer: 59

## 2019-09-14 DIAGNOSIS — Z23 Encounter for immunization: Secondary | ICD-10-CM

## 2019-12-09 ENCOUNTER — Other Ambulatory Visit: Payer: Self-pay

## 2019-12-09 ENCOUNTER — Ambulatory Visit: Payer: 59 | Attending: Internal Medicine

## 2019-12-09 DIAGNOSIS — Z20822 Contact with and (suspected) exposure to covid-19: Secondary | ICD-10-CM

## 2019-12-10 LAB — NOVEL CORONAVIRUS, NAA: SARS-CoV-2, NAA: NOT DETECTED

## 2020-01-28 ENCOUNTER — Encounter: Payer: Self-pay | Admitting: Family Medicine

## 2020-01-31 ENCOUNTER — Ambulatory Visit: Payer: 59 | Attending: Internal Medicine

## 2020-01-31 DIAGNOSIS — Z20822 Contact with and (suspected) exposure to covid-19: Secondary | ICD-10-CM

## 2020-02-01 LAB — NOVEL CORONAVIRUS, NAA: SARS-CoV-2, NAA: NOT DETECTED

## 2020-02-07 ENCOUNTER — Other Ambulatory Visit: Payer: Self-pay | Admitting: Family Medicine

## 2020-02-28 LAB — HM DIABETES EYE EXAM

## 2020-03-05 ENCOUNTER — Other Ambulatory Visit: Payer: Self-pay | Admitting: Family Medicine

## 2020-03-16 ENCOUNTER — Other Ambulatory Visit: Payer: Self-pay

## 2020-03-17 ENCOUNTER — Ambulatory Visit (INDEPENDENT_AMBULATORY_CARE_PROVIDER_SITE_OTHER): Payer: 59 | Admitting: Family Medicine

## 2020-03-17 ENCOUNTER — Encounter: Payer: Self-pay | Admitting: Family Medicine

## 2020-03-17 ENCOUNTER — Ambulatory Visit: Payer: 59 | Admitting: Family Medicine

## 2020-03-17 VITALS — BP 124/68 | HR 76 | Temp 98.0°F | Wt 203.0 lb

## 2020-03-17 DIAGNOSIS — E8881 Metabolic syndrome: Secondary | ICD-10-CM

## 2020-03-17 DIAGNOSIS — I10 Essential (primary) hypertension: Secondary | ICD-10-CM

## 2020-03-17 DIAGNOSIS — Z8659 Personal history of other mental and behavioral disorders: Secondary | ICD-10-CM

## 2020-03-17 DIAGNOSIS — R7401 Elevation of levels of liver transaminase levels: Secondary | ICD-10-CM

## 2020-03-17 DIAGNOSIS — E785 Hyperlipidemia, unspecified: Secondary | ICD-10-CM | POA: Diagnosis not present

## 2020-03-17 DIAGNOSIS — E1165 Type 2 diabetes mellitus with hyperglycemia: Secondary | ICD-10-CM

## 2020-03-17 LAB — BASIC METABOLIC PANEL
BUN: 13 mg/dL (ref 6–23)
CO2: 30 mEq/L (ref 19–32)
Calcium: 9 mg/dL (ref 8.4–10.5)
Chloride: 99 mEq/L (ref 96–112)
Creatinine, Ser: 0.6 mg/dL (ref 0.40–1.20)
GFR: 106.67 mL/min (ref >60.00)
Glucose, Bld: 210 mg/dL — ABNORMAL HIGH (ref 70–99)
Potassium: 4.2 mEq/L (ref 3.5–5.1)
Sodium: 136 mEq/L (ref 135–145)

## 2020-03-17 LAB — HEPATIC FUNCTION PANEL
ALT: 52 U/L — ABNORMAL HIGH (ref 0–35)
AST: 54 U/L — ABNORMAL HIGH (ref 0–37)
Albumin: 4.1 g/dL (ref 3.5–5.2)
Alkaline Phosphatase: 95 U/L (ref 39–117)
Bilirubin, Direct: 0.1 mg/dL (ref 0.0–0.3)
Total Bilirubin: 0.7 mg/dL (ref 0.2–1.2)
Total Protein: 6.7 g/dL (ref 6.0–8.3)

## 2020-03-17 LAB — LIPID PANEL
Cholesterol: 233 mg/dL — ABNORMAL HIGH (ref 0–200)
HDL: 36.5 mg/dL — ABNORMAL LOW (ref >39.00)
Total CHOL/HDL Ratio: 6
Triglycerides: 618 mg/dL — ABNORMAL HIGH (ref 0.0–149.0)

## 2020-03-17 LAB — HEMOGLOBIN A1C: Hgb A1c MFr Bld: 8.7 % — ABNORMAL HIGH (ref 4.6–6.5)

## 2020-03-17 LAB — LDL CHOLESTEROL, DIRECT: Direct LDL: 72 mg/dL

## 2020-03-17 MED ORDER — FLUOXETINE HCL 20 MG PO CAPS: 20.0000 mg | ORAL_CAPSULE | Freq: Every day | ORAL | 3 refills | Status: DC

## 2020-03-17 MED ORDER — BISOPROLOL-HYDROCHLOROTHIAZIDE 5-6.25 MG PO TABS: 1.0000 | ORAL_TABLET | Freq: Every day | ORAL | 3 refills | Status: DC

## 2020-03-17 NOTE — Progress Notes (Signed)
Subjective:     Patient ID: Melissa Vasquez, female   DOB: September 11, 1971, 49 y.o.   MRN: [id number]  HPI Melissa Vasquez is seen for medical follow-up.  She has history of hypertension and recurrent depression.  She is on Prozac 20 mg daily which seems to be working fairly well and also takes Ziac.  Her blood pressures been stable.  She lost her father earlier this year to bladder cancer and this is been a very difficult year for her but she feels the Prozac is helped.  She does have history of type 2 diabetes and had been on Metformin but states she did not feel well when taking the Metformin and stopped this on her own.  She has not been monitoring blood sugars.  No polyuria or polydipsia.  Last A1c was 6.7%.  She has had some mild elevated liver transaminase in the past.  Mom has history of nonalcoholic fatty liver disease.  Ms. he has history of dyslipidemia with high triglycerides over 500 and low HDL which goes along with her metabolic syndrome  Past Medical History:  Diagnosis Date  . HYPERLIPIDEMIA 03/16/2009  . HYPERTENSION 03/16/2009  . PANIC DISORDER 03/16/2009   Past Surgical History:  Procedure Laterality Date  . BUNIONECTOMY  1995   bilateral  . Melissa Vasquez    reports that she quit smoking about 11 years ago. Her smoking use included cigarettes. She has a 3.00 pack-year smoking history. She has never used smokeless tobacco. She reports previous alcohol use. She reports that she does not use drugs. family history includes Alcohol abuse in an other family member; Diabetes in an other family member; Heart disease in an other family member; Hyperlipidemia in an other family member; Hypertension in an other family member; Mental illness in an other family member. No Known Allergies '  Review of Systems  Constitutional: Negative for fatigue and unexpected weight change.  Eyes: Negative for visual disturbance.  Respiratory: Negative for cough, chest tightness, shortness of breath  and wheezing.   Cardiovascular: Negative for chest pain, palpitations and leg swelling.  Endocrine: Negative for polydipsia and polyuria.  Neurological: Negative for dizziness, seizures, syncope, weakness, light-headedness and headaches.       Objective:   Physical Exam Vitals reviewed.  Constitutional:      Appearance: Normal appearance.  Cardiovascular:     Rate and Rhythm: Normal rate and regular rhythm.  Pulmonary:     Effort: Pulmonary effort is normal.     Breath sounds: Normal breath sounds.  Musculoskeletal:     Right lower leg: No edema.     Left lower leg: No edema.  Neurological:     Mental Status: She is alert.        Assessment:     #1 hypertension stable and at goal  #2 history of recurrent depression currently stable on Prozac  #3 history of type 2 diabetes currently not treated.  Previous mild intolerance with Metformin  #4 history of dyslipidemia with high triglycerides and low HDL  #5 history of mild elevated liver transaminases.  High risk for fatty liver disease    Plan:     -Refilled her Ziac and Prozac for 1 year  -She is encouraged to lose some weight and keep high glycemic foods down  -Check labs with basic metabolic panel, hemoglobin A1c, hepatic panel, lipid panel  -If A1c up consider possible treatment with low-dose Actos especially if liver enzymes remain slightly elevated.  Also consider abdominal ultrasound  to further assess for fatty liver issues  Eulas Post MD [facility]

## 2020-03-19 NOTE — Addendum Note (Signed)
Addended by: Eulas Post on: 03/19/2020 09:12 PM   Modules accepted: Orders

## 2020-03-21 ENCOUNTER — Encounter: Payer: Self-pay | Admitting: Family Medicine

## 2020-03-22 MED ORDER — METFORMIN HCL 500 MG PO TABS: 500.0000 mg | ORAL_TABLET | Freq: Two times a day (BID) | ORAL | 3 refills | Status: DC

## 2020-03-24 ENCOUNTER — Ambulatory Visit
Admission: RE | Admit: 2020-03-24 | Discharge: 2020-03-24 | Disposition: A | Discharge status: Home / Self Care | Payer: 59 | Source: Ambulatory Visit | Attending: Family Medicine | Admitting: Family Medicine

## 2020-03-24 DIAGNOSIS — R7401 Elevation of levels of liver transaminase levels: Secondary | ICD-10-CM

## 2020-03-28 ENCOUNTER — Encounter: Payer: Self-pay | Admitting: Family Medicine

## 2020-03-29 ENCOUNTER — Encounter: Payer: Self-pay | Admitting: Family Medicine

## 2020-04-24 ENCOUNTER — Encounter: Payer: Self-pay | Admitting: Family Medicine

## 2020-04-25 ENCOUNTER — Telehealth (INDEPENDENT_AMBULATORY_CARE_PROVIDER_SITE_OTHER): Payer: 59 | Admitting: Family Medicine

## 2020-04-25 DIAGNOSIS — K760 Fatty (change of) liver, not elsewhere classified: Secondary | ICD-10-CM | POA: Diagnosis not present

## 2020-04-25 DIAGNOSIS — E1165 Type 2 diabetes mellitus with hyperglycemia: Secondary | ICD-10-CM | POA: Diagnosis not present

## 2020-04-25 DIAGNOSIS — R7401 Elevation of levels of liver transaminase levels: Secondary | ICD-10-CM | POA: Diagnosis not present

## 2020-04-25 DIAGNOSIS — E785 Hyperlipidemia, unspecified: Secondary | ICD-10-CM

## 2020-04-25 NOTE — Progress Notes (Signed)
Patient ID: Melissa Vasquez, female   DOB: 12-07-1970, 49 y.o.   MRN: [id number]  This visit type was conducted due to national recommendations for restrictions regarding the COVID-19 pandemic in an effort to limit this patient's exposure and mitigate transmission in our community.   Virtual Visit via Video Note  I connected with Kanasia Mallet on 04/25/20 at  8:00 AM EDT by a video enabled telemedicine application and verified that I am speaking with the correct person using two identifiers.  Location patient: home Location provider:work or home office Persons participating in the virtual visit: patient, provider  I discussed the limitations of evaluation and management by telemedicine and the availability of in person appointments. The patient expressed understanding and agreed to proceed.   HPI:  [name] was seen recently and had multiple lab abnormalities including A1c 8.7%, mild elevated liver transaminases and triglycerides of 618 with HDL 36.5.  We obtained ultrasound which confirmed fatty liver changes.  Her mom has history of nonalcoholic fatty liver disease.  We have concerns about her extremely high risk of having progressive complications of fatty liver over time.  She has gained some weight recently and has not been exercising.  We recently started back Metformin for her poorly controlled diabetes.  We had suggested Ozempic which we thought could help with weight loss but she was concerned about potential side effects.  We also discussed Actos today and mentioned its potential benefit with fatty liver disease but she has concerns because of prior concerns for bladder cancer risk and her dad died of bladder cancer complications  Fortunately, she does not drink any alcohol.  She already has plans to start walking more.  She is tolerating Metformin thus far  Past Medical History:  Diagnosis Date  . HYPERLIPIDEMIA 03/16/2009  . HYPERTENSION 03/16/2009  . PANIC DISORDER 03/16/2009   Past Surgical  History:  Procedure Laterality Date  . BUNIONECTOMY  1995   bilateral  . [place]    reports that she quit smoking about 11 years ago. Her smoking use included cigarettes. She has a 3.00 pack-year smoking history. She has never used smokeless tobacco. She reports previous alcohol use. She reports that she does not use drugs. family history includes Alcohol abuse in an other family member; Diabetes in an other family member; Heart disease in an other family member; Hyperlipidemia in an other family member; Hypertension in an other family member; Mental illness in an other family member. No Known Allergies    ROS: See pertinent positives and negatives per HPI.  Past Medical History:  Diagnosis Date  . HYPERLIPIDEMIA 03/16/2009  . HYPERTENSION 03/16/2009  . PANIC DISORDER 03/16/2009    Past Surgical History:  Procedure Laterality Date  . BUNIONECTOMY  1995   bilateral  . WISDOM TOOTH EXTRACTION  1995    Family History  Problem Relation Age of Onset  . Alcohol abuse Other   . Hyperlipidemia Other   . Hypertension Other   . Mental illness Other   . Diabetes Other   . Heart disease Other     SOCIAL HX: No alcohol use.  Quit smoking 3 years ago   Current Outpatient Medications:  .  Betamethasone Dipropionate (SERNIVO) 0.05 % EMUL, Apply topically., Disp: , Rfl:  .  bisoprolol-hydrochlorothiazide (ZIAC) 5-6.25 MG tablet, Take 1 tablet by mouth daily., Disp: 90 tablet, Rfl: 3 .  Cholecalciferol (VITAMIN D3) 125 MCG (5000 UT) CAPS, Take by mouth daily., Disp: , Rfl:  .  Clobetasol Propionate (CLOBEX SPRAY) 0.05 % external spray, Clobex 0.05 % topical spray  APPLY TO THE AFFECTED AREA(S) BY TOPICAL ROUTE 2 TIMES PER DAY ; RUB IN GENTLY AND COMPLETELY, Disp: , Rfl:  .  FLUoxetine (PROZAC) 20 MG capsule, Take 1 capsule (20 mg total) by mouth daily., Disp: 90 capsule, Rfl: 3 .  fluticasone (FLONASE) 50 MCG/ACT nasal spray, USE 2 SPRAYS IN EACH NOSTRIL ONCE DAILY AT  NIGHT, Disp: , Rfl: 0 .  metFORMIN (GLUCOPHAGE) 500 MG tablet, Take 1 tablet (500 mg total) by mouth 2 (two) times daily with a meal., Disp: 180 tablet, Rfl: 3 .  metroNIDAZOLE (METROGEL) 0.75 % vaginal gel, , Disp: , Rfl:  .  minocycline (MINOCIN,DYNACIN) 100 MG capsule, , Disp: , Rfl:   EXAM:  VITALS per patient if applicable:  GENERAL: alert, oriented, appears well and in no acute distress  HEENT: atraumatic, conjunttiva clear, no obvious abnormalities on inspection of external nose and ears  NECK: normal movements of the head and neck  LUNGS: on inspection no signs of respiratory distress, breathing rate appears normal, no obvious gross SOB, gasping or wheezing  CV: no obvious cyanosis  MS: moves all visible extremities without noticeable abnormality  PSYCH/NEURO: pleasant and cooperative, no obvious depression or anxiety, speech and thought processing grossly intact  ASSESSMENT AND PLAN:  Discussed the following assessment and plan:  Type 2 diabetes mellitus with hyperglycemia, without long-term current use of insulin (HCC)  NAFL (nonalcoholic fatty liver)  Dyslipidemia  Elevated transaminase level  Long discussion with patient.  We expressed our concerns about her progression risks to cirrhosis over time.  We are especially concerned in view of the fact that her mom has nonalcoholic fatty liver disease and cirrhosis.  Her diabetes is poorly controlled recent A1c is 8.7% and also poor control of lipids with numbers as above.  We suggested several things as follows  -Stressed first and foremost importance of weight loss.  She already is restricting calories and started exercising -We discussed medication such as Actos or GLP-1 medication and she declines both. -Recheck in 3 months and recheck lipids, A1c, and hepatic panel at that time -Establish regular exercise -Avoid alcohol which she is already doing    I discussed the assessment and treatment plan with the  patient. The patient was provided an opportunity to ask questions and all were answered. The patient agreed with the plan and demonstrated an understanding of the instructions.   The patient was advised to call back or seek an in-person evaluation if the symptoms worsen or if the condition fails to improve as anticipated.    Carolann Littler, MD

## 2020-06-03 ENCOUNTER — Other Ambulatory Visit: Payer: Self-pay | Admitting: Physician Assistant

## 2020-06-16 ENCOUNTER — Ambulatory Visit: Payer: 59 | Admitting: Family Medicine

## 2020-07-21 ENCOUNTER — Ambulatory Visit: Payer: 59 | Admitting: Family Medicine

## 2020-08-07 ENCOUNTER — Ambulatory Visit: Payer: 59 | Admitting: Family Medicine

## 2020-08-15 ENCOUNTER — Ambulatory Visit: Payer: 59 | Admitting: Family Medicine

## 2020-08-25 ENCOUNTER — Encounter: Payer: Self-pay | Admitting: Family Medicine

## 2020-08-25 ENCOUNTER — Other Ambulatory Visit: Payer: Self-pay

## 2020-08-25 ENCOUNTER — Ambulatory Visit: Payer: 59 | Admitting: Family Medicine

## 2020-08-25 VITALS — BP 138/90 | HR 69 | Temp 98.3°F | Ht 62.5 in | Wt 198.0 lb

## 2020-08-25 DIAGNOSIS — E785 Hyperlipidemia, unspecified: Secondary | ICD-10-CM | POA: Diagnosis not present

## 2020-08-25 DIAGNOSIS — E1165 Type 2 diabetes mellitus with hyperglycemia: Secondary | ICD-10-CM

## 2020-08-25 DIAGNOSIS — K760 Fatty (change of) liver, not elsewhere classified: Secondary | ICD-10-CM

## 2020-08-25 DIAGNOSIS — I10 Essential (primary) hypertension: Secondary | ICD-10-CM

## 2020-08-25 DIAGNOSIS — Z23 Encounter for immunization: Secondary | ICD-10-CM

## 2020-08-25 NOTE — Progress Notes (Signed)
Established Patient Office Visit  Subjective:  Patient ID: Melissa Vasquez, female    DOB: [birth year]  Age: 49 y.o. MRN: [id number]  CC: No chief complaint on file.   HPI Melissa Vasquez presents for medical follow-up.  She has history of obesity, type 2 diabetes, dyslipidemia, metabolic syndrome, nonalcoholic fatty liver disease.  We recently started Metformin.  Last April she had A1c of 8.7%.  She has hyperlipidemia with hypertriglyceridemia predominantly.  She is taking over-the-counter omega-3 supplement.  Tolerating Metformin well.  Not monitoring blood sugars.  No polyuria or polydipsia.  She has had eye exam in the past year.  No neuropathy symptoms.  Needs urine microalbumin.  Also requesting flu vaccine.  She declines Pneumovax today.  Past Medical History:  Diagnosis Date  . HYPERLIPIDEMIA 03/16/2009  . HYPERTENSION 03/16/2009  . PANIC DISORDER 03/16/2009    Past Surgical History:  Procedure Laterality Date  . BUNIONECTOMY  1995   bilateral  . WISDOM TOOTH EXTRACTION  1995    Family History  Problem Relation Age of Onset  . Alcohol abuse Other   . Hyperlipidemia Other   . Hypertension Other   . Mental illness Other   . Diabetes Other   . Heart disease Other     Social History   Socioeconomic History  . Marital status: Married    Spouse name: Not on file  . Number of children: Not on file  . Years of education: Not on file  . Highest education level: Not on file  Occupational History  . Not on file  Tobacco Use  . Smoking status: Former Smoker    Packs/day: 0.20    Years: 15.00    Pack years: 3.00    Types: Cigarettes    Quit date: 01/02/2009    Years since quitting: 11.6  . Smokeless tobacco: Never Used  Vaping Use  . Vaping Use: Never used  Substance and Sexual Activity  . Alcohol use: Not Currently  . Drug use: Never  . Sexual activity: Yes  Other Topics Concern  . Not on file  Social History Narrative  . Not on file   Social Determinants of Health    Financial Resource Strain:   . Difficulty of Paying Living Expenses: Not on file  Food Insecurity:   . Worried About Charity fundraiser in the Last Year: Not on file  . Ran Out of Food in the Last Year: Not on file  Transportation Needs:   . Lack of Transportation (Medical): Not on file  . Lack of Transportation (Non-Medical): Not on file  Physical Activity:   . Days of Exercise per Week: Not on file  . Minutes of Exercise per Session: Not on file  Stress:   . Feeling of Stress : Not on file  Social Connections:   . Frequency of Communication with Friends and Family: Not on file  . Frequency of Social Gatherings with Friends and Family: Not on file  . Attends Religious Services: Not on file  . Active Member of Clubs or Organizations: Not on file  . Attends Archivist Meetings: Not on file  . Marital Status: Not on file  Intimate Partner Violence:   . Fear of Current or Ex-Partner: Not on file  . Emotionally Abused: Not on file  . Physically Abused: Not on file  . Sexually Abused: Not on file    Outpatient Medications Prior to Visit  Medication Sig Dispense Refill  . bisoprolol-hydrochlorothiazide (ZIAC) 5-6.25 MG  tablet Take 1 tablet by mouth daily. 90 tablet 3  . Cholecalciferol (VITAMIN D3) 125 MCG (5000 UT) CAPS Take by mouth daily.    . Clobetasol Propionate (CLOBEX SPRAY) 0.05 % external spray Clobex 0.05 % topical spray  APPLY TO THE AFFECTED AREA(S) BY TOPICAL ROUTE 2 TIMES PER DAY ; RUB IN GENTLY AND COMPLETELY    . FLUoxetine (PROZAC) 20 MG capsule Take 1 capsule (20 mg total) by mouth daily. 90 capsule 3  . fluticasone (FLONASE) 50 MCG/ACT nasal spray USE 2 SPRAYS IN EACH NOSTRIL ONCE DAILY AT NIGHT  0  . metFORMIN (GLUCOPHAGE) 500 MG tablet Take 1 tablet (500 mg total) by mouth 2 (two) times daily with a meal. 180 tablet 3  . metroNIDAZOLE (METROGEL) 0.75 % vaginal gel     . minocycline (MINOCIN,DYNACIN) 100 MG capsule  (Patient not taking: Reported on  08/25/2020)    . Betamethasone Dipropionate (SERNIVO) 0.05 % EMUL Apply topically.     No facility-administered medications prior to visit.    No Known Allergies  ROS Review of Systems  Constitutional: Negative for fatigue and unexpected weight change.  Eyes: Negative for visual disturbance.  Respiratory: Negative for cough, chest tightness, shortness of breath and wheezing.   Cardiovascular: Negative for chest pain, palpitations and leg swelling.  Endocrine: Negative for polydipsia and polyuria.  Neurological: Negative for dizziness, seizures, syncope, weakness, light-headedness and headaches.      Objective:    Physical Exam Constitutional:      Appearance: She is well-developed.  Eyes:     Pupils: Pupils are equal, round, and reactive to light.  Neck:     Thyroid: No thyromegaly.     Vascular: No JVD.  Cardiovascular:     Rate and Rhythm: Normal rate and regular rhythm.     Heart sounds: No gallop.   Pulmonary:     Effort: Pulmonary effort is normal. No respiratory distress.     Breath sounds: Normal breath sounds. No wheezing or rales.  Musculoskeletal:     Cervical back: Neck supple.  Skin:    Comments: Feet reveal no skin lesions. Good distal foot pulses. Good capillary refill. No calluses. Normal sensation with monofilament testing   Neurological:     Mental Status: She is alert.     BP 138/90 (BP Location: Left Arm, Cuff Size: Normal)   Pulse 69   Temp 98.3 F (36.8 C) (Oral)   Ht 5' 2.5" (1.588 m)   Wt 198 lb (89.8 kg)   SpO2 98%   BMI 35.64 kg/m  Wt Readings from Last 3 Encounters:  08/25/20 198 lb (89.8 kg)  03/17/20 203 lb (92.1 kg)  12/10/18 198 lb (89.8 kg)     Health Maintenance Due  Topic Date Due  . Hepatitis C Screening  Never done  . INFLUENZA VACCINE  06/18/2020    There are no preventive care reminders to display for this patient.  Lab Results  Component Value Date   TSH 3.43 09/30/2018   Lab Results  Component Value Date    WBC 6.0 07/29/2018   HGB 14.8 07/29/2018   HCT 42.6 07/29/2018   MCV 85.7 07/29/2018   PLT 230.0 07/29/2018   Lab Results  Component Value Date   NA 136 03/17/2020   K 4.2 03/17/2020   CO2 30 03/17/2020   GLUCOSE 210 (H) 03/17/2020   BUN 13 03/17/2020   CREATININE 0.60 03/17/2020   BILITOT 0.7 03/17/2020   ALKPHOS 95 03/17/2020   AST  54 (H) 03/17/2020   ALT 52 (H) 03/17/2020   PROT 6.7 03/17/2020   ALBUMIN 4.1 03/17/2020   CALCIUM 9.0 03/17/2020   GFR 106.67 03/17/2020   Lab Results  Component Value Date   CHOL 233 (H) 03/17/2020   Lab Results  Component Value Date   HDL 36.50 (L) 03/17/2020   No results found for: [facility] Lab Results  Component Value Date   TRIG (H) 03/17/2020    618.0 Triglyceride is over 400; calculations on Lipids are invalid.   Lab Results  Component Value Date   CHOLHDL 6 03/17/2020   Lab Results  Component Value Date   HGBA1C 8.7 (H) 03/17/2020      Assessment & Plan:   Problem List Items Addressed This Visit      Unprioritized   NAFL (nonalcoholic fatty liver)   Type 2 diabetes mellitus with hyperglycemia ([place]) - Primary   Relevant Orders   Hemoglobin A1c   Microalbumin / creatinine urine ratio   Dyslipidemia   Relevant Orders   Lipid panel   Hepatic function panel   Essential hypertension    Patient has history of poorly controlled diabetes and just placed on medication last spring.  Recheck A1c today.  If not to goal we will maximize Metformin dosage first and then consider other options such as GLP-1 medication  Flu vaccine given  Recheck labs including A1c, lipid panel, hepatic panel, urine microalbumin screen  Continue weight loss efforts.  We recommend she try to monitor blood pressure at home.  Recheck in 3 months and if not improved at that point consider addition of angiotensin receptor blocker or ACE inhibitor  No orders of the defined types were placed in this encounter.   Follow-up: Return in about 3  months (around 11/25/2020).    Carolann Littler, MD

## 2020-08-26 LAB — HEPATIC FUNCTION PANEL
AG Ratio: 1.6 (calc) (ref 1.0–2.5)
ALT: 44 U/L — ABNORMAL HIGH (ref 6–29)
AST: 38 U/L — ABNORMAL HIGH (ref 10–35)
Albumin: 4.1 g/dL (ref 3.6–5.1)
Alkaline phosphatase (APISO): 78 U/L (ref 31–125)
Bilirubin, Direct: 0.1 mg/dL (ref 0.0–0.2)
Globulin: 2.5 g/dL (calc) (ref 1.9–3.7)
Indirect Bilirubin: 0.7 mg/dL (calc) (ref 0.2–1.2)
Total Bilirubin: 0.8 mg/dL (ref 0.2–1.2)
Total Protein: 6.6 g/dL (ref 6.1–8.1)

## 2020-08-26 LAB — MICROALBUMIN / CREATININE URINE RATIO
Creatinine, Urine: 265 mg/dL (ref 20–275)
Microalb Creat Ratio: 28 mcg/mg creat (ref <30)
Microalb, Ur: 7.4 mg/dL

## 2020-08-26 LAB — LIPID PANEL
Cholesterol: 191 mg/dL (ref <200)
HDL: 39 mg/dL — ABNORMAL LOW (ref >=50)
LDL Cholesterol (Calc): 110 mg/dL (calc) — ABNORMAL HIGH
Non-HDL Cholesterol (Calc): 152 mg/dL (calc) — ABNORMAL HIGH (ref <130)
Total CHOL/HDL Ratio: 4.9 (calc) (ref <5.0)
Triglycerides: 318 mg/dL — ABNORMAL HIGH (ref <150)

## 2020-08-26 LAB — HEMOGLOBIN A1C
Hgb A1c MFr Bld: 8.2 % of total Hgb — ABNORMAL HIGH (ref <5.7)
Mean Plasma Glucose: 189 (calc)
eAG (mmol/L): 10.4 (calc)

## 2020-10-06 ENCOUNTER — Other Ambulatory Visit: Payer: Self-pay | Admitting: Physician Assistant

## 2020-10-09 ENCOUNTER — Ambulatory Visit: Payer: 59 | Admitting: Dermatology

## 2020-11-06 ENCOUNTER — Encounter: Payer: Self-pay | Admitting: Dermatology

## 2020-11-06 ENCOUNTER — Ambulatory Visit: Payer: 59 | Admitting: Dermatology

## 2020-11-06 ENCOUNTER — Other Ambulatory Visit: Payer: Self-pay

## 2020-11-06 DIAGNOSIS — L918 Other hypertrophic disorders of the skin: Secondary | ICD-10-CM

## 2020-11-06 DIAGNOSIS — L400 Psoriasis vulgaris: Secondary | ICD-10-CM

## 2020-11-06 MED ORDER — CLOBETASOL PROPIONATE 0.05 % EX LIQD: CUTANEOUS | 11 refills | Status: DC

## 2020-11-06 MED ORDER — CLOBETASOL PROPIONATE 0.05 % EX LIQD: Freq: Two times a day (BID) | CUTANEOUS | 11 refills | Status: DC

## 2020-11-08 ENCOUNTER — Encounter: Payer: Self-pay | Admitting: Dermatology

## 2020-11-08 NOTE — Progress Notes (Signed)
   Follow-Up Visit   Subjective  Melissa Vasquez is a 49 y.o. female who presents for the following: Psoriasis (Ears, scalp, right pinky- tx- clobex spray- keeps clear & skin tags- under left arm & left eye x 2).  Psoriasis Location: Now a few patches on head and extremities Duration:  Quality:  Associated Signs/Symptoms: Modifying Factors: Clobetasol solution helpful Severity:  Timing: Context: Also requests removal of skin tags  Objective  Well appearing patient in no apparent distress; mood and affect are within normal limits. Objective  Left Ear, Right Ear, Scalp: Roughly 2% BSA small plaque psoriasis.  No synovitis.  Has been getting a generic clobetasol solution rather than Clobex spray because of cost, but this is helpful.  I did spend time reviewing all the recent systemic options for psoriasis but for now these are not indicated for Melissa Vasquez.  Objective  Left Axilla: Fleshy, skin-colored  pedunculated papules.     A focused examination was performed including Head, neck, torso, extremities.. Relevant physical exam findings are noted in the Assessment and Plan.   Assessment & Plan    Psoriasis vulgaris (3) Left Ear; Right Ear; Scalp  Continue Clobetasol Solution.  Reminded to avoid using this on face and body folds, and ideally will use it episodically.  Reordered Medications Clobetasol Propionate (TEMOVATE) 0.05 % external spray  Skin tag Left Axilla  Snip tag today axilla.  Will need to schedule a longer visit to do spots on margin of left eyelid.. No waiver per Dr. Stephania Fragmin, Lavonna Monarch, MD, have reviewed all documentation for this visit.  The documentation on 11/08/20 for the exam, diagnosis, procedures, and orders are all accurate and complete.

## 2021-01-04 ENCOUNTER — Other Ambulatory Visit: Payer: Self-pay

## 2021-01-04 ENCOUNTER — Ambulatory Visit (INDEPENDENT_AMBULATORY_CARE_PROVIDER_SITE_OTHER): Payer: 59 | Admitting: Dermatology

## 2021-01-04 ENCOUNTER — Encounter: Payer: Self-pay | Admitting: Dermatology

## 2021-01-04 DIAGNOSIS — L918 Other hypertrophic disorders of the skin: Secondary | ICD-10-CM

## 2021-01-04 DIAGNOSIS — D485 Neoplasm of uncertain behavior of skin: Secondary | ICD-10-CM

## 2021-01-04 NOTE — Progress Notes (Addendum)
   Follow-Up Visit   Subjective  Melissa Vasquez is a 50 y.o. female who presents for the following: Procedure (Patient here today for removal of two skin tags on left lower eyelid.).  Growths Location: Left  eyelid Duration:  Quality:  Associated Signs/Symptoms: Modifying Factors:  Severity:  Timing: Context:   Objective  Well appearing patient in no apparent distress; mood and affect are within normal limits. Objective  Left Lower Eyelid: Pink 3 mm papule near lower left inner eyelid margin.  Inflamed keratosis more likely with CIS.     Objective  Left Temple: 2 mm pedunculated pink papule.  Patient requests removal.   A focused examination was performed including Face, eyelids.. Relevant physical exam findings are noted in the Assessment and Plan.   Assessment & Plan    Neoplasm of uncertain behavior of skin Left Lower Eyelid  Skin / nail biopsy Type of biopsy: tangential   Informed consent: discussed and consent obtained   Timeout: patient name, date of birth, surgical site, and procedure verified   Procedure prep:  Patient was prepped and draped in usual sterile fashion (Non sterile) Prep type:  Chlorhexidine Anesthesia: the lesion was anesthetized in a standard fashion   Anesthetic:  1% lidocaine w/ epinephrine 1-100,000 local infiltration Instrument used: flexible razor blade   Outcome: patient tolerated procedure well   Post-procedure details: wound care instructions given    Specimen 1 - Surgical pathology Differential Diagnosis: skin tag  Check Margins: No  Skin tag Left Temple  Surprisingly required four scissor i to get the entire tag with a smooth result.  Patient remarkably tolerant (no local anesthetic).  Epidermal / dermal shaving - Left Temple  Lesion diameter (cm):  0.1 Informed consent: discussed and consent obtained   Timeout: patient name, date of birth, surgical site, and procedure verified   Instrument used: scissors   Hemostasis  achieved with: ferric subsulfate   Outcome: patient tolerated procedure well    Destruction of lesion - Left Temple   After removal of the tiny tag on the left outer eyelid and the more flat keratotic papule on the left inner eyelid margin, Oleta pointed out a tiny pink bump which is relatively new on the left lower palpebral conjunctiva.  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   I, Lavonna Monarch, MD, have reviewed all documentation for this visit.  The documentation on 01/19/21 for the exam, diagnosis, procedures, and orders are all accurate and complete.

## 2021-01-04 NOTE — Patient Instructions (Signed)

## 2021-01-07 ENCOUNTER — Encounter: Payer: Self-pay | Admitting: Dermatology

## 2021-02-27 ENCOUNTER — Other Ambulatory Visit: Payer: Self-pay | Admitting: Family Medicine

## 2021-03-25 ENCOUNTER — Other Ambulatory Visit: Payer: Self-pay | Admitting: Family Medicine

## 2021-06-22 ENCOUNTER — Other Ambulatory Visit: Payer: Self-pay | Admitting: Family Medicine

## 2021-06-24 ENCOUNTER — Other Ambulatory Visit: Payer: Self-pay | Admitting: Family Medicine

## 2021-06-27 ENCOUNTER — Other Ambulatory Visit: Payer: Self-pay | Admitting: Family Medicine

## 2021-07-04 ENCOUNTER — Encounter: Payer: Self-pay | Admitting: Family Medicine

## 2021-07-04 DIAGNOSIS — R319 Hematuria, unspecified: Secondary | ICD-10-CM

## 2021-07-10 ENCOUNTER — Telehealth: Payer: 59 | Admitting: Emergency Medicine

## 2021-07-10 ENCOUNTER — Encounter: Payer: Self-pay | Admitting: Family Medicine

## 2021-07-10 ENCOUNTER — Other Ambulatory Visit: Payer: Self-pay

## 2021-07-10 ENCOUNTER — Ambulatory Visit: Payer: 59 | Admitting: Family Medicine

## 2021-07-10 VITALS — BP 150/88 | HR 80 | Temp 98.2°F | Wt 190.8 lb

## 2021-07-10 DIAGNOSIS — R8290 Unspecified abnormal findings in urine: Secondary | ICD-10-CM

## 2021-07-10 DIAGNOSIS — E785 Hyperlipidemia, unspecified: Secondary | ICD-10-CM | POA: Diagnosis not present

## 2021-07-10 DIAGNOSIS — R319 Hematuria, unspecified: Secondary | ICD-10-CM

## 2021-07-10 DIAGNOSIS — I10 Essential (primary) hypertension: Secondary | ICD-10-CM

## 2021-07-10 LAB — POCT URINALYSIS DIPSTICK
Bilirubin, UA: NEGATIVE
Blood, UA: POSITIVE
Glucose, UA: NEGATIVE
Ketones, UA: NEGATIVE
Nitrite, UA: NEGATIVE
Protein, UA: POSITIVE — AB
Spec Grav, UA: 1.015 (ref 1.010–1.025)
Urobilinogen, UA: 0.2 E.U./dL
pH, UA: 5.5 (ref 5.0–8.0)

## 2021-07-10 MED ORDER — NITROFURANTOIN MONOHYD MACRO 100 MG PO CAPS
100.0000 mg | ORAL_CAPSULE | Freq: Two times a day (BID) | ORAL | 0 refills | Status: DC
Start: 2021-07-10 — End: 2021-08-01

## 2021-07-10 NOTE — Progress Notes (Addendum)
Established Patient Office Visit  Subjective:  Patient ID: Melissa Vasquez, female    DOB: 12-Jun-1971  Age: 50 y.o. MRN: [id number]  CC: No chief complaint on file.   HPI Melissa Vasquez presents for onset 4 days ago of very dark-colored urine.  She states this is almost tea colored and she increase her hydration and over the past day or so this has been more "cranberry juice" colored.  He does relate some suprapubic pressure but no significant burning with urination.  No significant frequency.  She has had some bilateral low back pain.  She does have prior history of kidney stones but states this is different.  No acute pain.  She has had previous ablation procedure for her uterus and does not have any menstrual cycle.  No recent vaginal bleeding.  Denies any fevers or chills.  No nausea or vomiting.  She was tested positive for COVID about a week ago.  Minimally symptomatic.  She also states she had frequent UTIs as a child and at one point had a urethral dilatation.  She states she has not had many UTIs as an adult.  No recent UTI.  No recent bladder instrumentation.  She has type 2 diabetes and is overdue for follow-up for that.  She has hypertension history as well.  She has not been monitoring her blood sugars or blood pressure recently.  She takes Ziac for hypertension.  She thinks she has some stress issues regarding the blood in the urine and that that may be raising her blood pressure somewhat.  She is concerned because her father died of bladder cancer complications couple years ago.  Past Medical History:  Diagnosis Date   HYPERLIPIDEMIA 03/16/2009   HYPERTENSION 03/16/2009   PANIC DISORDER 03/16/2009    Past Surgical History:  Procedure Laterality Date   BUNIONECTOMY  1995   bilateral   WISDOM TOOTH EXTRACTION  1995    Family History  Problem Relation Age of Onset   Alcohol abuse Other    Hyperlipidemia Other    Hypertension Other    Mental illness Other    Diabetes Other     Heart disease Other     Social History   Socioeconomic History   Marital status: Married    Spouse name: Not on file   Number of children: Not on file   Years of education: Not on file   Highest education level: Not on file  Occupational History   Not on file  Tobacco Use   Smoking status: Former    Packs/day: 0.20    Years: 15.00    Pack years: 3.00    Types: Cigarettes    Quit date: 01/02/2009    Years since quitting: 12.5   Smokeless tobacco: Never  Vaping Use   Vaping Use: Never used  Substance and Sexual Activity   Alcohol use: Not Currently   Drug use: Never   Sexual activity: Yes  Other Topics Concern   Not on file  Social History Narrative   Not on file   Social Determinants of Health   Financial Resource Strain: Not on file  Food Insecurity: Not on file  Transportation Needs: Not on file  Physical Activity: Not on file  Stress: Not on file  Social Connections: Not on file  Intimate Partner Violence: Not on file    Outpatient Medications Prior to Visit  Medication Sig Dispense Refill   bisoprolol-hydrochlorothiazide (ZIAC) 5-6.25 MG tablet TAKE 1 TABLET BY MOUTH EVERY DAY  30 tablet 0   chlorhexidine (PERIDEX) 0.12 % solution SMARTSIG:By Mouth     Cholecalciferol (VITAMIN D3) 125 MCG (5000 UT) CAPS Take by mouth daily.     Clobetasol Propionate (TEMOVATE) 0.05 % external spray Apply topically 2 (two) times daily. 125 mL 11   FLUoxetine (PROZAC) 20 MG capsule TAKE 1 CAPSULE BY MOUTH EVERY DAY 90 capsule 0   fluticasone (FLONASE) 50 MCG/ACT nasal spray USE 2 SPRAYS IN EACH NOSTRIL ONCE DAILY AT NIGHT  0   metFORMIN (GLUCOPHAGE) 500 MG tablet Take 1 tablet (500 mg total) by mouth 2 (two) times daily with a meal. *Appointment required for future refills* 60 tablet 1   metroNIDAZOLE (METROGEL) 0.75 % gel metronidazole 0.75 % topical gel  APPLY TO FACE AT BEDTIME     metroNIDAZOLE (METROGEL) 0.75 % vaginal gel      minocycline (MINOCIN,DYNACIN) 100 MG capsule       No facility-administered medications prior to visit.    No Known Allergies  ROS Review of Systems  Constitutional:  Negative for fatigue.  Eyes:  Negative for visual disturbance.  Respiratory:  Negative for cough, chest tightness, shortness of breath and wheezing.   Cardiovascular:  Negative for chest pain, palpitations and leg swelling.  Genitourinary:  Positive for hematuria. Negative for flank pain, frequency and vaginal bleeding.  Musculoskeletal:  Positive for back pain.  Neurological:  Negative for dizziness, seizures, syncope, weakness, light-headedness and headaches.     Objective:    Physical Exam Vitals reviewed.  Constitutional:      Appearance: Normal appearance.  Cardiovascular:     Rate and Rhythm: Normal rate and regular rhythm.  Pulmonary:     Effort: Pulmonary effort is normal.     Breath sounds: Normal breath sounds.  Musculoskeletal:     Right lower leg: No edema.     Left lower leg: No edema.  Neurological:     Mental Status: She is alert.    BP (!) 150/88 (BP Location: Left Arm, Cuff Size: Normal)   Pulse 80   Temp 98.2 F (36.8 C) (Oral)   Wt 190 lb 12.8 oz (86.5 kg)   SpO2 99%   BMI 34.34 kg/m  Wt Readings from Last 3 Encounters:  07/10/21 190 lb 12.8 oz (86.5 kg)  08/25/20 198 lb (89.8 kg)  03/17/20 203 lb (92.1 kg)     Health Maintenance Due  Topic Date Due   Pneumococcal Vaccine 56-40 Years old (1 - PCV) Never done   Hepatitis C Screening  Never done   COLONOSCOPY (Pts 45-32yr Insurance coverage will need to be confirmed)  Never done   COVID-19 Vaccine (3 - Pfizer risk series) 03/22/2020   HEMOGLOBIN A1C  02/23/2021   OPHTHALMOLOGY EXAM  02/27/2021   INFLUENZA VACCINE  06/18/2021    There are no preventive care reminders to display for this patient.  Lab Results  Component Value Date   TSH 3.43 09/30/2018   Lab Results  Component Value Date   WBC 6.0 07/29/2018   HGB 14.8 07/29/2018   HCT 42.6 07/29/2018   MCV 85.7  07/29/2018   PLT 230.0 07/29/2018   Lab Results  Component Value Date   NA 136 03/17/2020   K 4.2 03/17/2020   CO2 30 03/17/2020   GLUCOSE 210 (H) 03/17/2020   BUN 13 03/17/2020   CREATININE 0.60 03/17/2020   BILITOT 0.8 08/25/2020   ALKPHOS 95 03/17/2020   AST 38 (H) 08/25/2020   ALT 44 (H) 08/25/2020  PROT 6.6 08/25/2020   ALBUMIN 4.1 03/17/2020   CALCIUM 9.0 03/17/2020   GFR 106.67 03/17/2020   Lab Results  Component Value Date   CHOL 191 08/25/2020   Lab Results  Component Value Date   HDL 39 (L) 08/25/2020   Lab Results  Component Value Date   LDLCALC 110 (H) 08/25/2020   Lab Results  Component Value Date   TRIG 318 (H) 08/25/2020   Lab Results  Component Value Date   CHOLHDL 4.9 08/25/2020   Lab Results  Component Value Date   HGBA1C 8.2 (H) 08/25/2020      Assessment & Plan:   #1 hematuria.  Patient relates 4-day history of hematuria.  She does have some suprapubic pressure.  She has had prior history of kidney stones but does not have any acute pain.  Positive family history of bladder cancer in her father.  -Urine dipstick does show small leukocytes.  She has some suprapubic pressure.  Urine culture sent to rule out infection.  We will also cover with Macrobid 1 twice daily for 5 days pending culture results -We elected not to send for urine microscopy given the fact that she has some visible blood clots in her urine today. -If urine culture negative will recommend urology evaluation for further work-up  #2 hypertension poorly controlled by today's reading. -She has recently lost some weight.  No regular alcohol use.  Continue Ziac and patient has scheduled follow-up in a few weeks and reassess at that time.  If still up at that point consider additional medication.  #3 type 2 diabetes.  History of poor control.  Last A1c was over 8.  Schedule follow-up in September and reassess A1c then along with other labs.   Meds ordered this encounter   Medications   nitrofurantoin, macrocrystal-monohydrate, (MACROBID) 100 MG capsule    Sig: Take 1 capsule (100 mg total) by mouth 2 (two) times daily.    Dispense:  10 capsule    Refill:  0    Follow-up: No follow-ups on file.    Carolann Littler, MD  Urine cx negative.   Setting up Urology referral secondary to gross hematuria.  Eulas Post MD [facility]

## 2021-07-10 NOTE — Progress Notes (Signed)
Yes, dark urine can be a sign of dehydration and kidney damage.  Based on your description, I recommend that you be seen in person so that you can have blood work and a urinalysis performed to assess your kidney function.   NOTE: There will be NO CHARGE for this eVisit   If you are having a true medical emergency please call 911.      For an urgent face to face visit, [facility] has six urgent care centers for your convenience:     [facility] [place] [facility] [place] Get Driving Directions [id number] [place], [place] [address]    [facility]) Get Driving Directions [id number] [place], [place] [address]  [facility] [place] [facility]) Get Driving Directions [id number] [address] [place],  [place]  [address]  [facility] Get Driving Directions [id number] [place], [place], [place] [address]   [facility] Get Driving Directions  [id number] [address] [place], [place] [address]   [facility] at  Get Driving Directions [id number] [address] [place], [place] [address]  Your MyChart E-visit questionnaire answers were reviewed by a board certified advanced clinical practitioner to complete your personal care plan based on your specific symptoms.  Thank you for using e-Visits.   Approximately 5 minutes was used in reviewing the patient's chart, questionnaire, prescribing medications, and documentation.

## 2021-07-11 LAB — URINE CULTURE
MICRO NUMBER:: 12279635
Result:: NO GROWTH
SPECIMEN QUALITY:: ADEQUATE

## 2021-07-12 ENCOUNTER — Encounter: Payer: Self-pay | Admitting: Family Medicine

## 2021-07-12 NOTE — Addendum Note (Signed)
Addended by: Eulas Post on: 07/12/2021 01:05 PM   Modules accepted: Orders

## 2021-07-17 NOTE — Telephone Encounter (Signed)
I have reached out to Neoma Laming in regard to this.

## 2021-07-25 ENCOUNTER — Other Ambulatory Visit: Payer: Self-pay | Admitting: Family Medicine

## 2021-07-27 NOTE — Addendum Note (Signed)
Addended by: Rebecca Eaton on: 07/27/2021 01:07 PM   Modules accepted: Orders

## 2021-07-27 NOTE — Telephone Encounter (Signed)
Spoke with Neoma Laming, she spoke with the patient who stated that she still wants this referral to go to the same office but she wants it placed as an urgent referral so that she can be seen sooner. Please advise.

## 2021-07-31 ENCOUNTER — Other Ambulatory Visit: Payer: Self-pay

## 2021-08-01 ENCOUNTER — Ambulatory Visit: Payer: 59 | Admitting: Family Medicine

## 2021-08-01 VITALS — BP 134/86 | HR 77 | Temp 98.1°F | Wt 191.1 lb

## 2021-08-01 DIAGNOSIS — E1165 Type 2 diabetes mellitus with hyperglycemia: Secondary | ICD-10-CM

## 2021-08-01 DIAGNOSIS — Z23 Encounter for immunization: Secondary | ICD-10-CM

## 2021-08-01 DIAGNOSIS — I10 Essential (primary) hypertension: Secondary | ICD-10-CM

## 2021-08-01 LAB — HEPATIC FUNCTION PANEL
ALT: 49 U/L — ABNORMAL HIGH (ref 0–35)
AST: 35 U/L (ref 0–37)
Albumin: 4.1 g/dL (ref 3.5–5.2)
Alkaline Phosphatase: 66 U/L (ref 39–117)
Bilirubin, Direct: 0.1 mg/dL (ref 0.0–0.3)
Total Bilirubin: 0.7 mg/dL (ref 0.2–1.2)
Total Protein: 6.9 g/dL (ref 6.0–8.3)

## 2021-08-01 LAB — BASIC METABOLIC PANEL
BUN: 14 mg/dL (ref 6–23)
CO2: 29 mEq/L (ref 19–32)
Calcium: 9.4 mg/dL (ref 8.4–10.5)
Chloride: 99 mEq/L (ref 96–112)
Creatinine, Ser: 0.59 mg/dL (ref 0.40–1.20)
GFR: 105.81 mL/min (ref >60.00)
Glucose, Bld: 156 mg/dL — ABNORMAL HIGH (ref 70–99)
Potassium: 4.3 mEq/L (ref 3.5–5.1)
Sodium: 138 mEq/L (ref 135–145)

## 2021-08-01 LAB — LIPID PANEL
Cholesterol: 181 mg/dL (ref 0–200)
HDL: 39 mg/dL — ABNORMAL LOW (ref >39.00)
NonHDL: 141.63
Total CHOL/HDL Ratio: 5
Triglycerides: 360 mg/dL — ABNORMAL HIGH (ref 0.0–149.0)
VLDL: 72 mg/dL — ABNORMAL HIGH (ref 0.0–40.0)

## 2021-08-01 LAB — MICROALBUMIN / CREATININE URINE RATIO
Creatinine,U: 92.4 mg/dL
Microalb Creat Ratio: 2.3 mg/g (ref 0.0–30.0)
Microalb, Ur: 2.1 mg/dL — ABNORMAL HIGH (ref 0.0–1.9)

## 2021-08-01 LAB — LDL CHOLESTEROL, DIRECT: Direct LDL: 95 mg/dL

## 2021-08-01 LAB — HEMOGLOBIN A1C: Hgb A1c MFr Bld: 7.9 % — ABNORMAL HIGH (ref 4.6–6.5)

## 2021-08-01 MED ORDER — FLUOXETINE HCL 20 MG PO CAPS: ORAL_CAPSULE | ORAL | 3 refills | Status: AC

## 2021-08-01 MED ORDER — BISOPROLOL-HYDROCHLOROTHIAZIDE 5-6.25 MG PO TABS: 1.0000 | ORAL_TABLET | Freq: Every day | ORAL | 3 refills | Status: DC

## 2021-08-01 NOTE — Progress Notes (Signed)
Established Patient Office Visit  Subjective:  Patient ID: Melissa Vasquez, female    DOB: 1971/02/25  Age: 50 y.o. MRN: [id number]  CC:  Chief Complaint  Patient presents with   Follow-up    HPI Melissa Vasquez presents for medical follow-up.  She had recent episode of hematuria and had some mild flank discomfort.  She ended up going to a [facility] facility elsewhere and had x-rays which showed probable 4 mm right distal ureter stone.  She was prescribed Flomax.  Her back symptoms have resolved and she is seen no further blood.  She has been anxious because her father died of complications of bladder cancer couple years ago.  She has been referred to urology but cannot get appointment till November.  Recent urine culture was negative.  No flank pain at this time.  Her chronic medical problems include history of hypertension, nonalcoholic fatty liver, type 2 diabetes, dyslipidemia.  Not currently on statin.  Last A1c was 8.2%.  She is overdue for labs.  Only current diabetic medication is metformin.  Patient has not had pancreatitis previously but her mother has.  There is no known family history of medullary thyroid cancer.  Past Medical History:  Diagnosis Date   HYPERLIPIDEMIA 03/16/2009   HYPERTENSION 03/16/2009   PANIC DISORDER 03/16/2009    Past Surgical History:  Procedure Laterality Date   BUNIONECTOMY  1995   bilateral   WISDOM TOOTH EXTRACTION  1995    Family History  Problem Relation Age of Onset   Alcohol abuse Other    Hyperlipidemia Other    Hypertension Other    Mental illness Other    Diabetes Other    Heart disease Other     Social History   Socioeconomic History   Marital status: Married    Spouse name: Not on file   Number of children: Not on file   Years of education: Not on file   Highest education level: Not on file  Occupational History   Not on file  Tobacco Use   Smoking status: Former    Packs/day: 0.20    Years: 15.00    Pack years: 3.00    Types:  Cigarettes    Quit date: 01/02/2009    Years since quitting: 12.5   Smokeless tobacco: Never  Vaping Use   Vaping Use: Never used  Substance and Sexual Activity   Alcohol use: Not Currently   Drug use: Never   Sexual activity: Yes  Other Topics Concern   Not on file  Social History Narrative   Not on file   Social Determinants of Health   Financial Resource Strain: Not on file  Food Insecurity: Not on file  Transportation Needs: Not on file  Physical Activity: Not on file  Stress: Not on file  Social Connections: Not on file  Intimate Partner Violence: Not on file    Outpatient Medications Prior to Visit  Medication Sig Dispense Refill   chlorhexidine (PERIDEX) 0.12 % solution SMARTSIG:By Mouth     Cholecalciferol (VITAMIN D3) 125 MCG (5000 UT) CAPS Take by mouth daily.     Clobetasol Propionate (TEMOVATE) 0.05 % external spray Apply topically 2 (two) times daily. 125 mL 11   fluticasone (FLONASE) 50 MCG/ACT nasal spray USE 2 SPRAYS IN EACH NOSTRIL ONCE DAILY AT NIGHT  0   metFORMIN (GLUCOPHAGE) 500 MG tablet Take 1 tablet (500 mg total) by mouth 2 (two) times daily with a meal. *Appointment required for future refills* 60  tablet 1   metroNIDAZOLE (METROGEL) 0.75 % gel metronidazole 0.75 % topical gel  APPLY TO FACE AT BEDTIME     metroNIDAZOLE (METROGEL) 0.75 % vaginal gel      minocycline (MINOCIN,DYNACIN) 100 MG capsule      bisoprolol-hydrochlorothiazide (ZIAC) 5-6.25 MG tablet TAKE 1 TABLET BY MOUTH EVERY DAY 30 tablet 0   FLUoxetine (PROZAC) 20 MG capsule TAKE 1 CAPSULE BY MOUTH EVERY DAY 90 capsule 0   nitrofurantoin, macrocrystal-monohydrate, (MACROBID) 100 MG capsule Take 1 capsule (100 mg total) by mouth 2 (two) times daily. 10 capsule 0   No facility-administered medications prior to visit.    No Known Allergies  ROS Review of Systems  Constitutional:  Negative for chills and fever.  Respiratory:  Negative for shortness of breath.   Cardiovascular:   Negative for chest pain.  Genitourinary:  Negative for dysuria, flank pain and hematuria.     Objective:    Physical Exam Vitals reviewed.  Cardiovascular:     Rate and Rhythm: Normal rate and regular rhythm.  Pulmonary:     Effort: Pulmonary effort is normal.     Breath sounds: Normal breath sounds.  Musculoskeletal:     Right lower leg: No edema.     Left lower leg: No edema.  Neurological:     General: No focal deficit present.     Mental Status: She is alert.    BP 134/86 (BP Location: Left Arm, Cuff Size: Normal)   Pulse 77   Temp 98.1 F (36.7 C) (Oral)   Wt 191 lb 1.6 oz (86.7 kg)   SpO2 98%   BMI 34.40 kg/m  Wt Readings from Last 3 Encounters:  08/01/21 191 lb 1.6 oz (86.7 kg)  07/10/21 190 lb 12.8 oz (86.5 kg)  08/25/20 198 lb (89.8 kg)     Health Maintenance Due  Topic Date Due   Pneumococcal Vaccine 27-41 Years old (1 - PCV) Never done   Hepatitis C Screening  Never done   COLONOSCOPY (Pts 45-75yr Insurance coverage will need to be confirmed)  Never done   COVID-19 Vaccine (3 - Pfizer risk series) 03/22/2020   HEMOGLOBIN A1C  02/23/2021   OPHTHALMOLOGY EXAM  02/27/2021    There are no preventive care reminders to display for this patient.  Lab Results  Component Value Date   TSH 3.43 09/30/2018   Lab Results  Component Value Date   WBC 6.0 07/29/2018   HGB 14.8 07/29/2018   HCT 42.6 07/29/2018   MCV 85.7 07/29/2018   PLT 230.0 07/29/2018   Lab Results  Component Value Date   NA 136 03/17/2020   K 4.2 03/17/2020   CO2 30 03/17/2020   GLUCOSE 210 (H) 03/17/2020   BUN 13 03/17/2020   CREATININE 0.60 03/17/2020   BILITOT 0.8 08/25/2020   ALKPHOS 95 03/17/2020   AST 38 (H) 08/25/2020   ALT 44 (H) 08/25/2020   PROT 6.6 08/25/2020   ALBUMIN 4.1 03/17/2020   CALCIUM 9.0 03/17/2020   GFR 106.67 03/17/2020   Lab Results  Component Value Date   CHOL 191 08/25/2020   Lab Results  Component Value Date   HDL 39 (L) 08/25/2020   Lab  Results  Component Value Date   LDLCALC 110 (H) 08/25/2020   Lab Results  Component Value Date   TRIG 318 (H) 08/25/2020   Lab Results  Component Value Date   CHOLHDL 4.9 08/25/2020   Lab Results  Component Value Date   HGBA1C 8.2 (  H) 08/25/2020      Assessment & Plan:   #1 recent hematuria.  Probably related to nephrolithiasis with visualized 4 mm right ureter stone with only mild hydronephrosis.  She has not seen any further episodes for hematuria but does have urologic evaluation pending.  #2 hypertension.  Repeat reading slightly improved. -Refill Ziac for 1 year. -Check basic metabolic panel -Continue weight loss efforts  #3 type 2 diabetes with history of poor control  -Recheck A1c and check urine micro-albumin -Consider addition of other medications such as GLP-1 if A1c still elevated -Recommend yearly diabetic eye exam -Flu vaccine given  #4 history of dyslipidemia.  Consider statin use but will check lipids first.  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    Dispense:  90 tablet    Refill:  3   FLUoxetine (PROZAC) 20 MG capsule    Sig: TAKE 1 CAPSULE BY MOUTH EVERY DAY    Dispense:  90 capsule    Refill:  3    Follow-up: No follow-ups on file.    Carolann Littler, MD

## 2021-08-02 ENCOUNTER — Encounter: Payer: Self-pay | Admitting: Family Medicine

## 2021-08-02 ENCOUNTER — Other Ambulatory Visit: Payer: Self-pay

## 2021-08-02 MED ORDER — OZEMPIC (0.25 OR 0.5 MG/DOSE) 2 MG/1.5ML ~~LOC~~ SOPN: 0.2500 mg | PEN_INJECTOR | SUBCUTANEOUS | 1 refills | Status: DC

## 2021-08-02 NOTE — Telephone Encounter (Signed)
Please advise 

## 2021-08-16 ENCOUNTER — Other Ambulatory Visit: Payer: Self-pay | Admitting: Family Medicine

## 2021-08-17 ENCOUNTER — Other Ambulatory Visit: Payer: Self-pay

## 2021-08-17 MED ORDER — METFORMIN HCL 500 MG PO TABS: 500.0000 mg | ORAL_TABLET | Freq: Two times a day (BID) | ORAL | 2 refills | Status: DC

## 2021-08-18 LAB — HM MAMMOGRAPHY: HM Mammogram: NORMAL (ref 0–4)

## 2021-08-18 LAB — HM PAP SMEAR: HM Pap smear: NORMAL

## 2021-08-30 DIAGNOSIS — F419 Anxiety disorder, unspecified: Secondary | ICD-10-CM | POA: Insufficient documentation

## 2021-08-30 DIAGNOSIS — E119 Type 2 diabetes mellitus without complications: Secondary | ICD-10-CM | POA: Insufficient documentation

## 2021-09-28 ENCOUNTER — Other Ambulatory Visit: Payer: Self-pay | Admitting: Family Medicine

## 2021-10-11 IMAGING — US US ABDOMEN LIMITED
1 series · 14 of 25 positions shown · non-contrast
Comparison: None.

CLINICAL DATA: Elevated liver enzymes

EXAM:
ULTRASOUND ABDOMEN LIMITED RIGHT UPPER QUADRANT

[Series 1: us abdomen limited · 0.23mm/px · 14 of 39 slices shown]
[im 1/39]
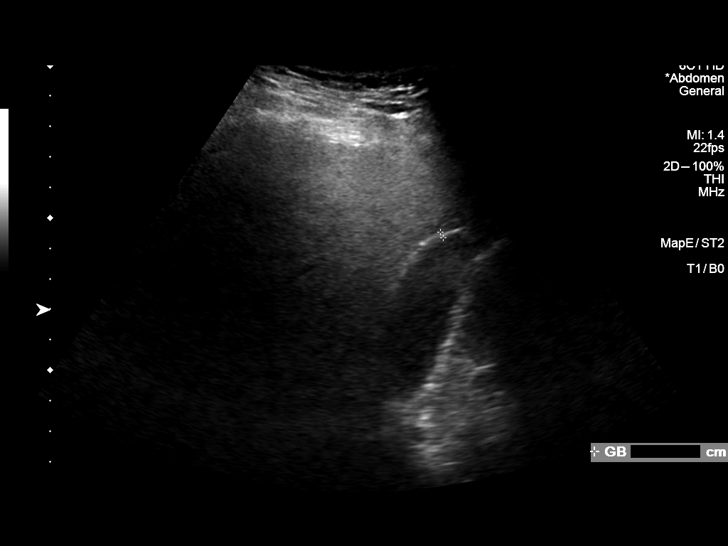
[im 4/39]
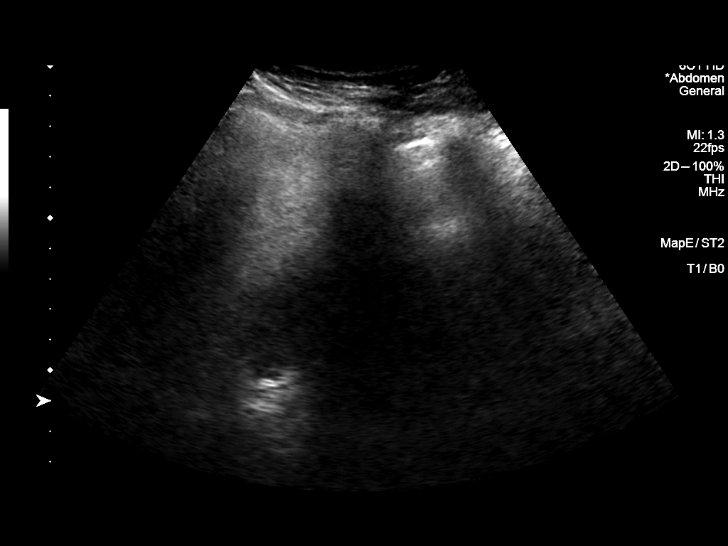
[im 7/39]
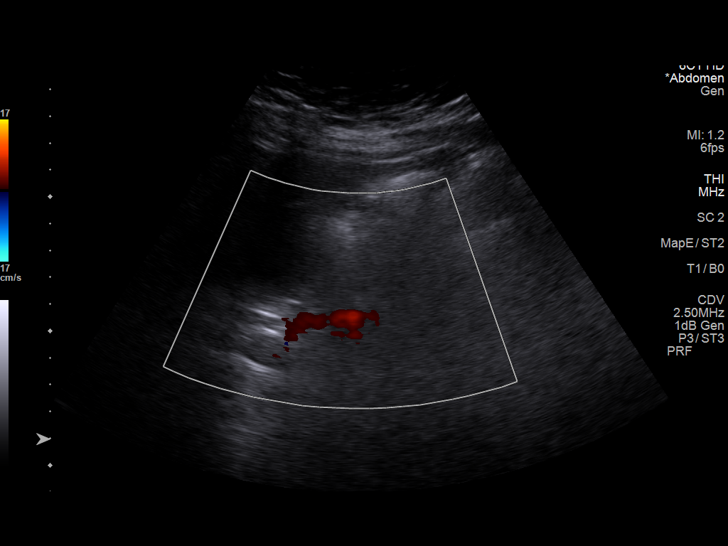
[im 10/39]
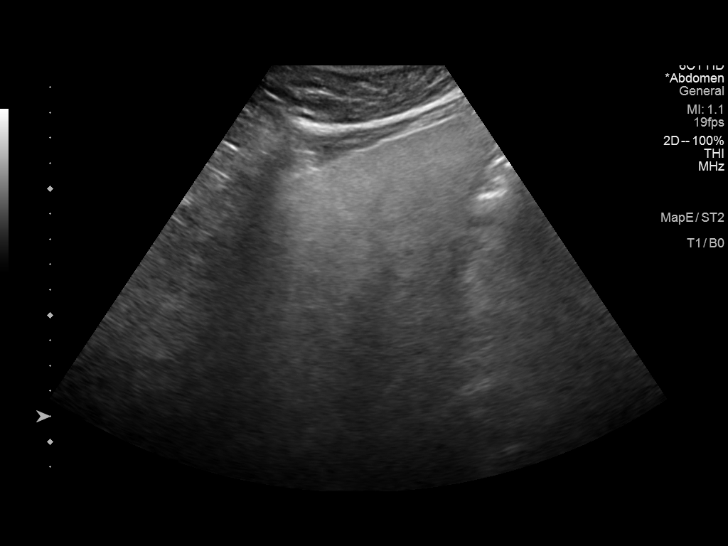
[im 13/39]
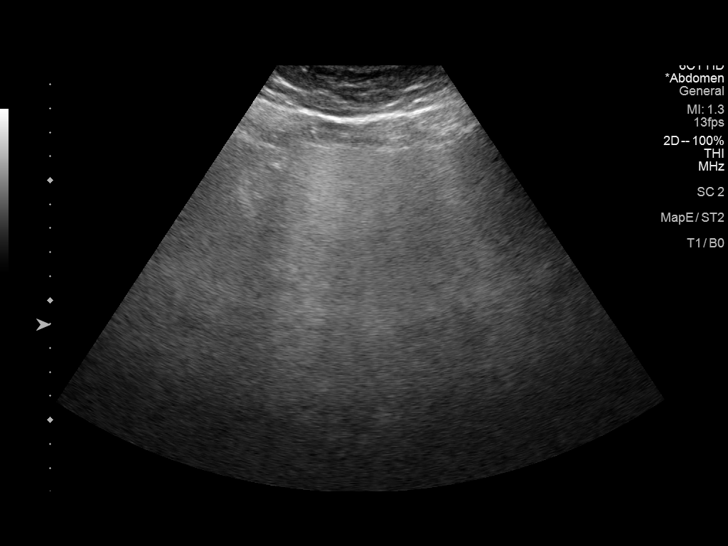
[im 15/39]
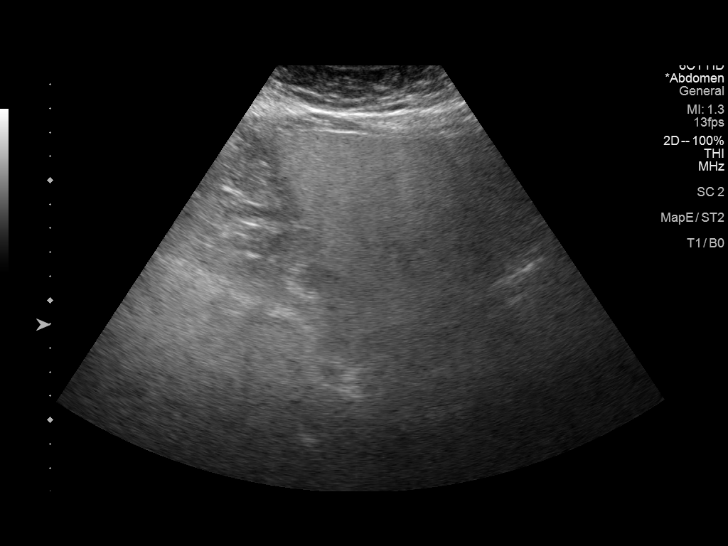
[im 18/39]
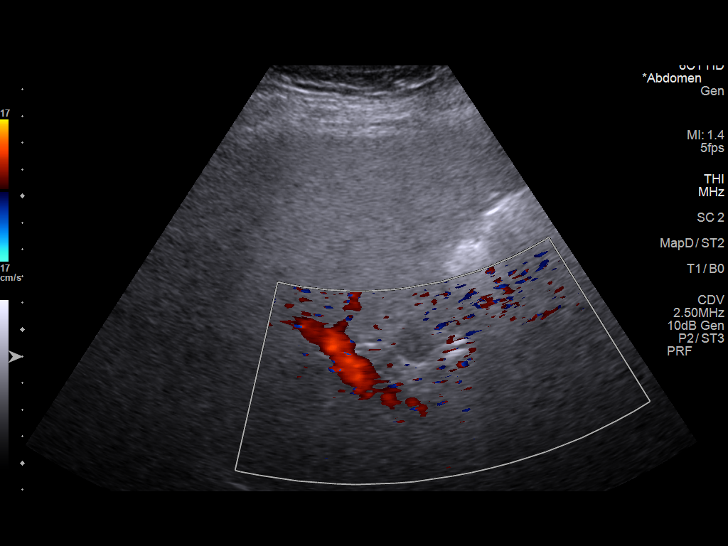
[im 21/39]
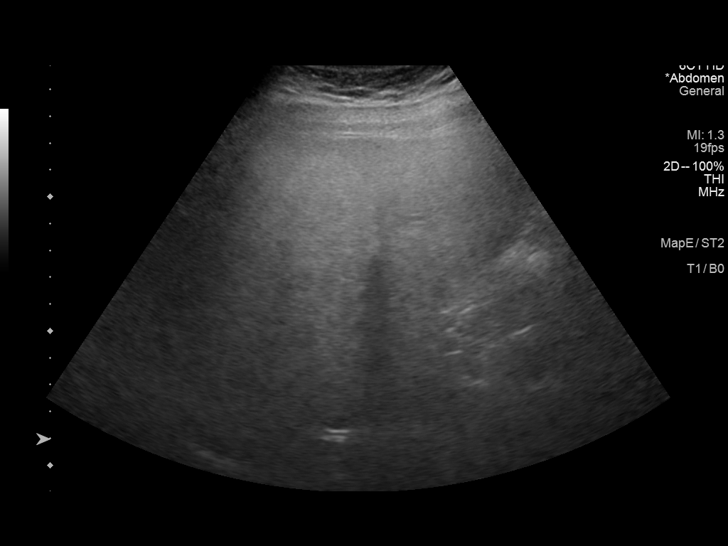
[im 24/39]
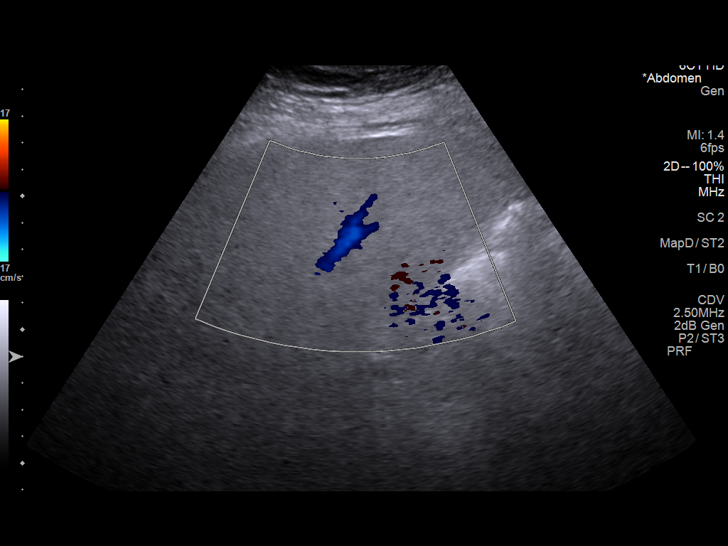
[im 26/39]
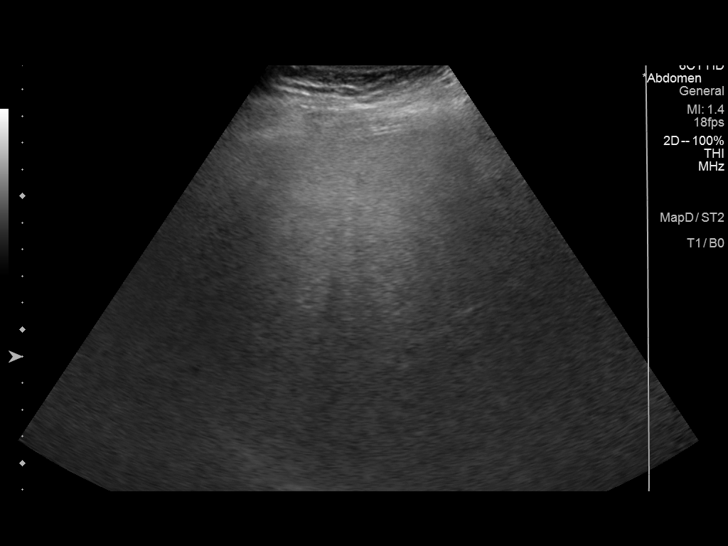
[im 29/39]
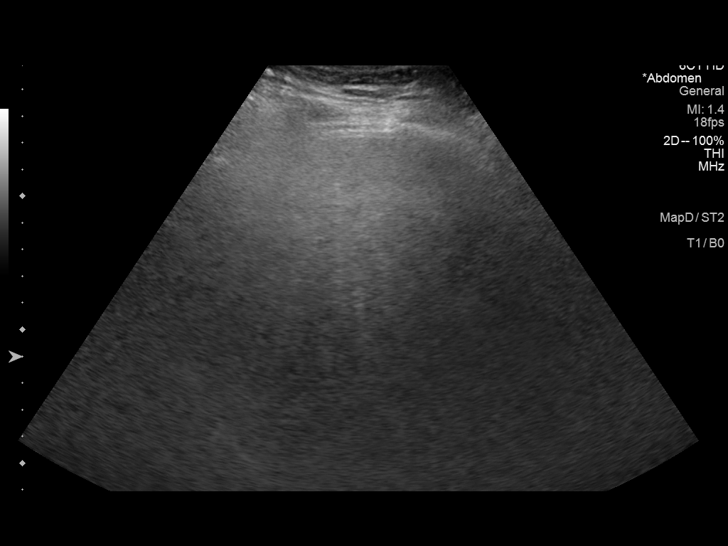
[im 32/39]
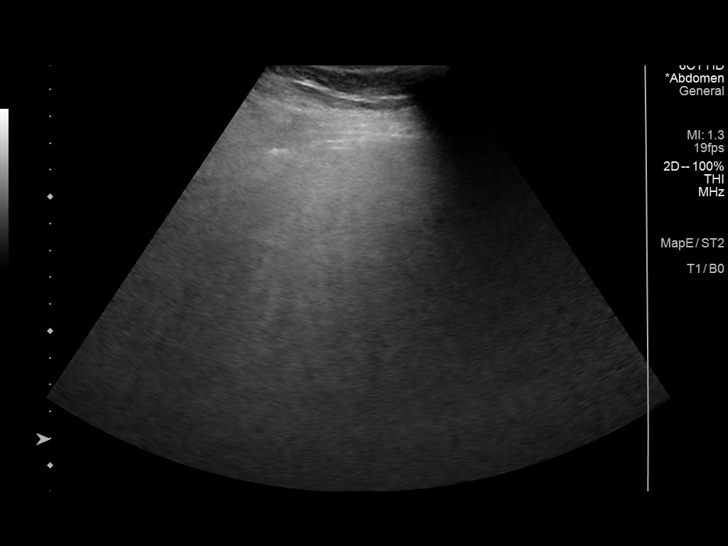
[im 35/39]
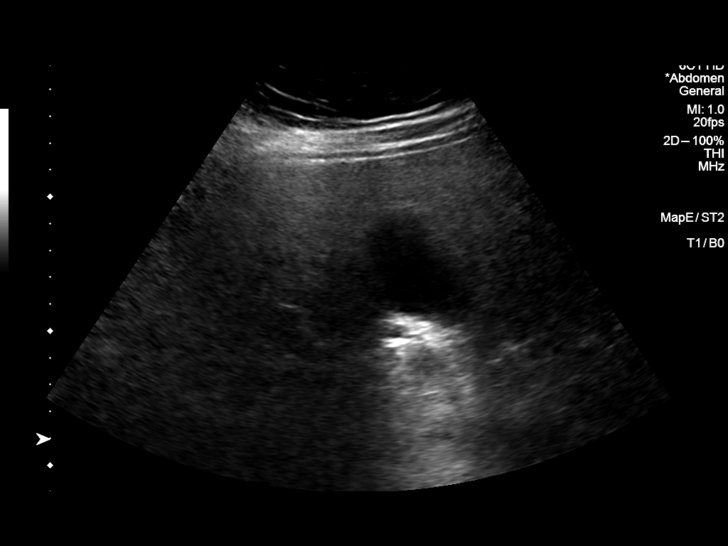
[im 39/39]
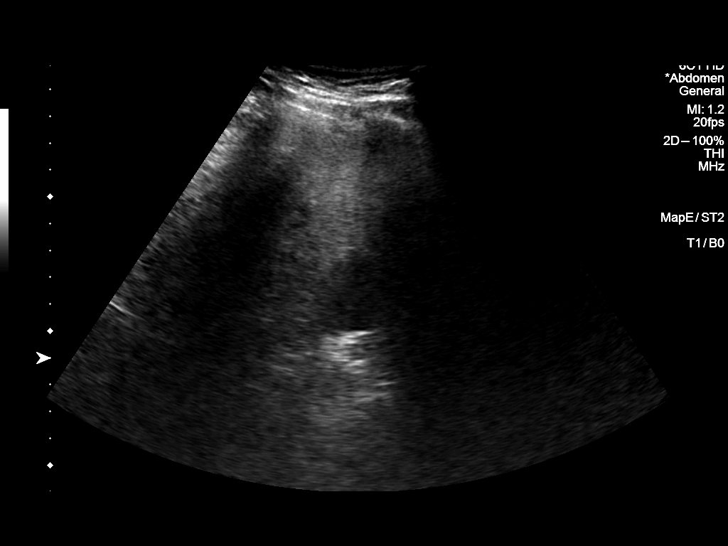

[14 of 25 positions shown; findings below may reference images not displayed]

FINDINGS: Gallbladder:

No gallstones or wall thickening visualized. There is no
pericholecystic fluid. No sonographic Murphy sign noted by
sonographer.

Common bile duct:

Diameter: 4 mm. No intrahepatic or extrahepatic biliary duct
dilatation.

Liver:

No focal lesion identified. There is marked diffuse increase in
liver echogenicity. Portal vein is patent on color Doppler imaging
with normal direction of blood flow towards the liver.

Other: None.
IMPRESSION: Marked diffuse increase in liver echogenicity, a finding indicative
of hepatic steatosis, potentially with underlying parenchymal liver
disease. While no focal liver lesions are evident on this study, it
must be cautioned that sensitivity of ultrasound for detection of
focal liver lesions is diminished significantly in this
circumstance.

Study otherwise unremarkable.

## 2021-11-29 ENCOUNTER — Other Ambulatory Visit: Payer: Self-pay | Admitting: Family Medicine

## 2021-12-03 LAB — HM DIABETES EYE EXAM

## 2021-12-11 ENCOUNTER — Encounter: Payer: Self-pay | Admitting: Family Medicine

## 2021-12-14 ENCOUNTER — Other Ambulatory Visit: Payer: Self-pay | Admitting: Dermatology

## 2021-12-14 DIAGNOSIS — L400 Psoriasis vulgaris: Secondary | ICD-10-CM

## 2021-12-28 ENCOUNTER — Other Ambulatory Visit: Payer: Self-pay | Admitting: Family Medicine

## 2022-01-18 ENCOUNTER — Encounter: Payer: Self-pay | Admitting: Family Medicine

## 2022-01-30 ENCOUNTER — Encounter: Payer: Self-pay | Admitting: Family Medicine

## 2022-01-30 ENCOUNTER — Ambulatory Visit: Payer: 59 | Admitting: Family Medicine

## 2022-01-30 VITALS — BP 148/90 | HR 81 | Temp 98.1°F | Ht 62.5 in | Wt 187.6 lb

## 2022-01-30 DIAGNOSIS — R202 Paresthesia of skin: Secondary | ICD-10-CM | POA: Diagnosis not present

## 2022-01-30 DIAGNOSIS — E1165 Type 2 diabetes mellitus with hyperglycemia: Secondary | ICD-10-CM

## 2022-01-30 DIAGNOSIS — I10 Essential (primary) hypertension: Secondary | ICD-10-CM | POA: Diagnosis not present

## 2022-01-30 LAB — TSH: TSH: 3.32 u[IU]/mL (ref 0.35–5.50)

## 2022-01-30 LAB — VITAMIN B12: Vitamin B-12: 255 pg/mL (ref 211–911)

## 2022-01-30 LAB — POCT GLYCOSYLATED HEMOGLOBIN (HGB A1C): Hemoglobin A1C: 5.9 % — AB (ref 4.0–5.6)

## 2022-01-30 MED ORDER — LOSARTAN POTASSIUM 50 MG PO TABS: 50.0000 mg | ORAL_TABLET | Freq: Every day | ORAL | 3 refills | Status: AC

## 2022-01-30 NOTE — Patient Instructions (Signed)
A1C is 5.9%!!  Great improvement ? ?Go ahead and increase the Ozempic to 0.5 mg once weekly ? ?Start the Losartan 50 mg once daily for hypertension.   ?

## 2022-01-30 NOTE — Progress Notes (Signed)
? ?Established Patient Office Visit ? ?Subjective:  ?Patient ID: Melissa Vasquez, female    DOB: May 13, 1971  Age: 51 y.o. MRN: [id number] ? ?CC:  ?Chief Complaint  ?Patient presents with  ? Diabetes  ? Medication Consultation  ? Numbness  ?  Patient complains of extremity numbness, x2 weeks   ? ? ?HPI ?Melissa Vasquez presents for medical follow-up.  Melissa Vasquez has history of hypertension, nonalcoholic fatty liver, type 2 diabetes, dyslipidemia.  We started Ozempic 0.25 mg subcutaneous once weekly several months ago.  She has tolerated well.  Our goal had been to titrate this to 0.5 but she still on the 0.25 mg.  Not monitoring sugars regularly.  She has noticed less nocturia.  Has lost a few pounds.  She would like to lose more.  Her last A1c was 7.9%. ? ?For years she has taken low-dose Ziac for her blood pressure.  She has noticed with several recent visits increased blood pressure both at the dentist and urologist.  No headaches.  No chest pains.  No peripheral edema. ? ?She is having new issue of somewhat intermittent numbness involving both hands as well as feet.  No weakness.  No significant pain.  Has been on metformin for some time.  No recent B12 level.  No regular antacid use. ? ?Wt Readings from Last 3 Encounters:  ?01/30/22 187 lb 9.6 oz (85.1 kg)  ?08/01/21 191 lb 1.6 oz (86.7 kg)  ?07/10/21 190 lb 12.8 oz (86.5 kg)  ? ? ? ?Past Medical History:  ?Diagnosis Date  ? HYPERLIPIDEMIA 03/16/2009  ? HYPERTENSION 03/16/2009  ? PANIC DISORDER 03/16/2009  ? ? ?Past Surgical History:  ?Procedure Laterality Date  ? BUNIONECTOMY  1995  ? bilateral  ? [employer]  ? ? ?Family History  ?Problem Relation Age of Onset  ? Alcohol abuse Other   ? Hyperlipidemia Other   ? Hypertension Other   ? Mental illness Other   ? Diabetes Other   ? Heart disease Other   ? ? ?Social History  ? ?Socioeconomic History  ? Marital status: Married  ?  Spouse name: Not on file  ? Number of children: Not on file  ? Years of education: Not  on file  ? Highest education level: Not on file  ?Occupational History  ? Not on file  ?Tobacco Use  ? Smoking status: Former  ?  Packs/day: 0.20  ?  Years: 15.00  ?  Pack years: 3.00  ?  Types: Cigarettes  ?  Quit date: 01/02/2009  ?  Years since quitting: 13.0  ? Smokeless tobacco: Never  ?Vaping Use  ? Vaping Use: Never used  ?Substance and Sexual Activity  ? Alcohol use: Not Currently  ? Drug use: Never  ? Sexual activity: Yes  ?Other Topics Concern  ? Not on file  ?Social History Narrative  ? Not on file  ? ?Social Determinants of Health  ? ?Financial Resource Strain: Not on file  ?Food Insecurity: Not on file  ?Transportation Needs: Not on file  ?Physical Activity: Not on file  ?Stress: Not on file  ?Social Connections: Not on file  ?Intimate Partner Violence: Not on file  ? ? ?Outpatient Medications Prior to Visit  ?Medication Sig Dispense Refill  ? bisoprolol-hydrochlorothiazide (ZIAC) 5-6.25 MG tablet Take 1 tablet by mouth daily. 90 tablet 3  ? Cholecalciferol (VITAMIN D3) 125 MCG (5000 UT) CAPS Take by mouth daily.    ? Clobetasol Propionate (TEMOVATE) 0.05 %  external spray APPLY TOPICALLY TWICE A DAY 60 mL 0  ? FLUoxetine (PROZAC) 20 MG capsule TAKE 1 CAPSULE BY MOUTH EVERY DAY 90 capsule 3  ? fluticasone (FLONASE) 50 MCG/ACT nasal spray USE 2 SPRAYS IN EACH NOSTRIL ONCE DAILY AT NIGHT  0  ? metFORMIN (GLUCOPHAGE) 500 MG tablet TAKE 1 TABLET BY MOUTH 2 TIMES DAILY WITH A MEAL. 60 tablet 2  ? metroNIDAZOLE (METROGEL) 0.75 % gel metronidazole 0.75 % topical gel ? APPLY TO FACE AT BEDTIME    ? Semaglutide,0.25 or 0.'5MG'$ /DOS, (OZEMPIC, 0.25 OR 0.5 MG/DOSE,) 2 MG/1.5ML SOPN Inject 0.5 mg into the skin.    ? OZEMPIC, 0.25 OR 0.5 MG/DOSE, 2 MG/1.5ML SOPN INJECT 0.'25MG'$  INTO THE SKIN ONE TIME PER WEEK 1.5 mL 1  ? chlorhexidine (PERIDEX) 0.12 % solution SMARTSIG:By Mouth    ? metroNIDAZOLE (METROGEL) 0.75 % vaginal gel     ? minocycline (MINOCIN,DYNACIN) 100 MG capsule     ? ?No facility-administered medications  prior to visit.  ? ? ?No Known Allergies ? ?ROS ?Review of Systems  ?Constitutional:  Negative for fatigue and unexpected weight change.  ?Eyes:  Negative for visual disturbance.  ?Respiratory:  Negative for cough, chest tightness, shortness of breath and wheezing.   ?Cardiovascular:  Negative for chest pain, palpitations and leg swelling.  ?Endocrine: Negative for polydipsia and polyuria.  ?Neurological:  Positive for numbness. Negative for dizziness, seizures, syncope, weakness, light-headedness and headaches.  ? ?  ?Objective:  ?  ?Physical Exam ?Constitutional:   ?   Appearance: She is well-developed.  ?Eyes:  ?   Pupils: Pupils are equal, round, and reactive to light.  ?Neck:  ?   Thyroid: No thyromegaly.  ?   Vascular: No JVD.  ?Cardiovascular:  ?   Rate and Rhythm: Normal rate and regular rhythm.  ?   Heart sounds:  ?  No gallop.  ?Pulmonary:  ?   Effort: Pulmonary effort is normal. No respiratory distress.  ?   Breath sounds: Normal breath sounds. No wheezing or rales.  ?Musculoskeletal:  ?   Cervical back: Neck supple.  ?Neurological:  ?   General: No focal deficit present.  ?   Mental Status: She is alert.  ?   Cranial Nerves: No cranial nerve deficit.  ?   Comments: Normal sensory function with monofilament testing in both hands  ? ? ?BP (!) 148/90 (BP Location: Left Arm, Cuff Size: Normal)   Pulse 81   Temp 98.1 ?F (36.7 ?C) (Oral)   Ht 5' 2.5" (1.588 m)   Wt 187 lb 9.6 oz (85.1 kg)   SpO2 95%   BMI 33.77 kg/m?  ?Wt Readings from Last 3 Encounters:  ?01/30/22 187 lb 9.6 oz (85.1 kg)  ?08/01/21 191 lb 1.6 oz (86.7 kg)  ?07/10/21 190 lb 12.8 oz (86.5 kg)  ? ? ? ?Health Maintenance Due  ?Topic Date Due  ? Hepatitis C Screening  Never done  ? COLONOSCOPY (Pts 45-49yr Insurance coverage will need to be confirmed)  Never done  ? COVID-19 Vaccine (3 - Pfizer risk series) 03/22/2020  ? FOOT EXAM  08/25/2021  ? ? ?There are no preventive care reminders to display for this patient. ? ?Lab Results   ?Component Value Date  ? TSH 3.43 09/30/2018  ? ?Lab Results  ?Component Value Date  ? WBC 6.0 07/29/2018  ? HGB 14.8 07/29/2018  ? HCT 42.6 07/29/2018  ? MCV 85.7 07/29/2018  ? PLT 230.0 07/29/2018  ? ?Lab Results  ?  Component Value Date  ? NA 138 08/01/2021  ? K 4.3 08/01/2021  ? CO2 29 08/01/2021  ? GLUCOSE 156 (H) 08/01/2021  ? BUN 14 08/01/2021  ? CREATININE 0.59 08/01/2021  ? BILITOT 0.7 08/01/2021  ? ALKPHOS 66 08/01/2021  ? AST 35 08/01/2021  ? ALT 49 (H) 08/01/2021  ? PROT 6.9 08/01/2021  ? ALBUMIN 4.1 08/01/2021  ? CALCIUM 9.4 08/01/2021  ? GFR 105.81 08/01/2021  ? ?Lab Results  ?Component Value Date  ? CHOL 181 08/01/2021  ? ?Lab Results  ?Component Value Date  ? HDL 39.00 (L) 08/01/2021  ? ?Lab Results  ?Component Value Date  ? [place] 110 (H) 08/25/2020  ? ?Lab Results  ?Component Value Date  ? TRIG 360.0 (H) 08/01/2021  ? ?Lab Results  ?Component Value Date  ? CHOLHDL 5 08/01/2021  ? ?Lab Results  ?Component Value Date  ? HGBA1C 5.9 (A) 01/30/2022  ? ? ?  ?Assessment & Plan:  ? ?#1 type 2 diabetes tremendously improved with A1c reduction from 7.9% to 5.9%.  She is currently only taking 0.25 mg Ozempic.  We would like to consider further titration especially in view of her fatty liver history and desire for additional weight loss.  We recommend she titrate this to 0.5 mg subcutaneous once weekly and set up 28-monthfollow-up ? ?#2 hypertension.  Poorly controlled both today and with multiple recent readings.  Add losartan 50 mg daily and continue Ziac.  She will monitor closely at home.  Our goal is less than 130/80 ? ?#3 diffuse bilateral upper and lower extremity paresthesias. ?-Check additional labs with B12, serum protein electrophoresis with immunofixation, and TSH.  A1c has improved as above. ?-Consider neurology referral if all above labs normal ? ? ?Meds ordered this encounter  ?Medications  ? losartan (COZAAR) 50 MG tablet  ?  Sig: Take 1 tablet (50 mg total) by mouth daily.  ?  Dispense:  90  tablet  ?  Refill:  3  ? ? ?Follow-up: Return in about 3 months (around 05/02/2022).  ? ? ?BCarolann Littler MD ?

## 2022-02-01 ENCOUNTER — Encounter: Payer: Self-pay | Admitting: Family Medicine

## 2022-02-04 LAB — IMMUNOFIXATION ELECTROPHORESIS
IgG (Immunoglobin G), Serum: 968 mg/dL (ref 600–1640)
IgM, Serum: 135 mg/dL (ref 50–300)
Immunofix Electr Int: NOT DETECTED
Immunoglobulin A: 281 mg/dL (ref 47–310)

## 2022-02-04 NOTE — Addendum Note (Signed)
Addended by: Nilda Riggs on: 02/04/2022 01:33 PM ? ? Modules accepted: Orders ? ?

## 2022-02-16 ENCOUNTER — Other Ambulatory Visit: Payer: Self-pay | Admitting: Family Medicine

## 2022-02-18 ENCOUNTER — Encounter: Payer: Self-pay | Admitting: Neurology

## 2022-02-18 ENCOUNTER — Ambulatory Visit: Payer: 59 | Admitting: Neurology

## 2022-02-18 VITALS — BP 107/72 | HR 76 | Ht 62.5 in | Wt 187.5 lb

## 2022-02-18 DIAGNOSIS — R202 Paresthesia of skin: Secondary | ICD-10-CM | POA: Diagnosis not present

## 2022-02-18 DIAGNOSIS — G4733 Obstructive sleep apnea (adult) (pediatric): Secondary | ICD-10-CM | POA: Insufficient documentation

## 2022-02-18 NOTE — Progress Notes (Signed)
? ?Chief Complaint  ?Patient presents with  ? New Patient (Initial Visit)  ?  Rm 14. Alone. ?NP internal referral for Paresthesia.  ? ? ? ? ?ASSESSMENT AND PLAN ? ?Melissa Vasquez is a 51 y.o. female   ?Bilateral hands and feet paresthesia ? Slight length-dependent sensory changes, decreased ankle reflex, suggestive of small fiber neuropathy, happening in the setting of diabetes, diabetes, hyperlipidemia, previous diabetes was not under optimal control, improved now, ? Laboratory evaluation to rule out inflammatory etiology, ?Obstructive sleep apnea ? Small narrow oropharyngeal space, nonrefreshing sleep, BMI 34 ? ? ?DIAGNOSTIC DATA (LABS, IMAGING, TESTING) ?- I reviewed patient records, labs, notes, testing and imaging myself where available. ? ?Labs: A1C 5.9 in March 2023,  up to 8.7 in April 2021,Normal B12 255, TSH, immunofixation protein electrophoresis ? ?September 2022, significantly elevated triglyceride, was up to 618 in April 21, improved to now 360, ? ?MEDICAL HISTORY: ? ?Melissa Vasquez is a 51 year old female, seen in request by   her primary care physician Dr. Eulas Post, for evaluation of bilateral feet and hands paresthesia, initial evaluation was on February 18, 2022 ? ?I reviewed and summarized the referring note.PMHx. ?HTN ?HLD ?DM since 2021 ?Depression ?Bunionectomy ? ?She had diabetes for few years, was previously not optimal control, A1c was 8.7 in April 2021 also with significant hyperlipidemia, elevated triglyceride up to 681 ? ?Over the past couple years, her hypertension, diabetes, hyperlipidemia is under better control now, A1c was 5.9 in March 2023 ? 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 ?Her lower extremity paresthesia last about 2 weeks, she denies significant low back pain, denies incontinence, denies gait abnormality, lower extremity paresthesia has receded to plantar surface, and between toes, she had swollen foreign object sensation ? ?She continues to have persistent bilateral fingers paresthesia, reported involving 3rd-5th finger most, ? ?She also complains of nonrefreshing sleep, fatigue, mild snoring, ? ?PHYSICAL EXAM: ?  ?Vitals:  ? 02/18/22 0801  ?BP: 107/72  ?Pulse: 76  ?Weight: 187 lb 8 oz (85 kg)  ?Height: 5' 2.5" (1.588 m)  ? ?Body mass index is 33.75 kg/m?. ? ?PHYSICAL EXAMNIATION: ? ?Gen: NAD, conversant, well nourised, well groomed                     ?Cardiovascular: Regular rate rhythm, no peripheral edema, warm, nontender. ?Eyes: Conjunctivae clear without exudates or hemorrhage ?Neck: Supple, no carotid bruits. ?Pulmonary: Clear to auscultation bilaterally  ? ?NEUROLOGICAL EXAM: ? ?MENTAL STATUS: ?Speech: ?   Speech is normal; fluent and spontaneous with normal comprehension.  ?Cognition: ?    Orientation to time, place and person ?    Normal recent and remote memory ?    Normal Attention span and concentration ?    Normal Language, naming, repeating,spontaneous speech ?    Fund of knowledge ?  ?CRANIAL NERVES: ?CN II: Visual fields are full to confrontation. Pupils are round equal and briskly reactive to light. ?CN III, IV, VI: extraocular movement are normal. No ptosis. ?CN V: Facial sensation is intact to light touch ?CN VII: Face is symmetric with normal eye closure  ?CN VIII: Hearing is normal to causal conversation. ?CN IX, X: Phonation is normal. ?CN XI: Head  turning and shoulder shrug are intact ?CN XII: Narrow oropharyngeal space ? ?MOTOR: ?There is no pronator drift of out-stretched arms. Muscle bulk and tone are normal. Muscle strength is normal. ? ?REFLEXES: ?Reflexes are 2+ and symmetric at the biceps, triceps, knees, and 1/1 at ankles. Plantar  responses are flexor. ? ?SENSORY: ?Length-dependent decreased to light touch, pinprick at midfoot level, and vibratory sensation are intact in fingers and toes. ? ?COORDINATION: ?There is no trunk or limb dysmetria noted. ? ?GAIT/STANCE: ?Posture is normal. Gait is steady with normal steps, base, arm swing, and turning. Heel and toe walking are normal. Tandem gait is normal.  ?Romberg is absent. ? ?REVIEW OF SYSTEMS:  ?Full 14 system review of systems performed and notable only for as above ?All other review of systems were negative. ? ? ?ALLERGIES: ?No Known Allergies ? ?HOME MEDICATIONS: ?Current Outpatient Medications  ?Medication Sig Dispense Refill  ? bisoprolol-hydrochlorothiazide (ZIAC) 5-6.25 MG tablet Take 1 tablet by mouth daily. 90 tablet 3  ? Cholecalciferol (VITAMIN D3) 125 MCG (5000 UT) CAPS Take by mouth daily.    ? Clobetasol Propionate (TEMOVATE) 0.05 % external spray APPLY TOPICALLY TWICE A DAY 60 mL 0  ? FLUoxetine (PROZAC) 20 MG capsule TAKE 1 CAPSULE BY MOUTH EVERY DAY 90 capsule 3  ? fluticasone (FLONASE) 50 MCG/ACT nasal spray USE 2 SPRAYS IN EACH NOSTRIL ONCE DAILY AT NIGHT  0  ? losartan (COZAAR) 50 MG tablet Take 1 tablet (50 mg total) by mouth daily. 90 tablet 3  ? metFORMIN (GLUCOPHAGE) 500 MG tablet TAKE 1 TABLET BY MOUTH 2 TIMES DAILY WITH A MEAL. 60 tablet 2  ? metroNIDAZOLE (METROGEL) 0.75 % gel metronidazole 0.75 % topical gel ? APPLY TO FACE AT BEDTIME    ? Semaglutide,0.25 or 0.'5MG'$ /DOS, (OZEMPIC, 0.25 OR 0.5 MG/DOSE,) 2 MG/1.5ML SOPN Inject 0.5 mg into the skin once a week.    ? ?No current facility-administered medications for this visit.  ? ? ?PAST MEDICAL HISTORY: ?Past Medical History:  ?Diagnosis Date  ? HYPERLIPIDEMIA 03/16/2009  ? HYPERTENSION 03/16/2009  ? PANIC DISORDER 03/16/2009  ? ? ?PAST SURGICAL HISTORY: ?Past Surgical History:  ?Procedure Laterality Date  ? BUNIONECTOMY  1995  ? bilateral  ? [place]  ? ? ?FAMILY HISTORY: ?Family History  ?Problem  Relation Age of Onset  ? Alcohol abuse Other   ? Hyperlipidemia Other   ? Hypertension Other   ? Mental illness Other   ? Diabetes Other   ? Heart disease Other   ? ? ?SOCIAL HISTORY: ?Social History  ? ?Socioeconomic History  ? Marital status: Married  ?  Spouse name: Not on file  ? Number of children: Not on file  ? Years of education: Not on file  ? Highest education level: Not on file  ?Occupational History  ? Not on file  ?Tobacco Use  ? Smoking status: Former  ?  Packs/day: 0.20  ?  Years: 15.00  ?  Pack years: 3.00  ?  Types: Cigarettes  ?  Quit date: 01/02/2009  ?  Years since quitting: 13.1  ? Smokeless tobacco: Never  ?Vaping Use  ? Vaping Use: Never used  ?Substance and Sexual Activity  ? Alcohol use: Not Currently  ? Drug use: Never  ? Sexual activity: Yes  ?Other Topics Concern  ? Not on file  ?Social History Narrative  ? Not on file  ? ?Social Determinants of Health  ? ?Financial Resource Strain: Not on file  ?Food  Insecurity: Not on file  ?Transportation Needs: Not on file  ?Physical Activity: Not on file  ?Stress: Not on file  ?Social Connections: Not on file  ?Intimate Partner Violence: Not on file  ? ? ? ? ?Marcial Pacas, M.D. Ph.D. ? ?[facility] ?[address], [address] ?[place], [place] [address] ?Ph: [phone]) [phone] ?Fax: [phone] ? ?CC:  Eulas Post, MD ?[address] ?[place],  [place] [address]  Eulas Post, MD   ?

## 2022-02-19 LAB — ANA W/REFLEX IF POSITIVE
Anti JO-1: <0.2 AI (ref 0.0–0.9)
Anti Nuclear Antibody (ANA): POSITIVE — AB
Centromere Ab Screen: <0.2 AI (ref 0.0–0.9)
Chromatin Ab SerPl-aCnc: <0.2 AI (ref 0.0–0.9)
ENA RNP Ab: 1.1 AI — ABNORMAL HIGH (ref 0.0–0.9)
ENA SM Ab Ser-aCnc: <0.2 AI (ref 0.0–0.9)
ENA SSA (RO) Ab: <0.2 AI (ref 0.0–0.9)
ENA SSB (LA) Ab: <0.2 AI (ref 0.0–0.9)
Scleroderma (Scl-70) (ENA) Antibody, IgG: <0.2 AI (ref 0.0–0.9)
dsDNA Ab: <1 IU/mL (ref 0–9)

## 2022-02-19 LAB — RPR: RPR Ser Ql: NONREACTIVE

## 2022-02-19 LAB — SEDIMENTATION RATE: Sed Rate: 3 mm/hr (ref 0–32)

## 2022-02-19 LAB — FOLATE: Folate: >20 ng/mL (ref >3.0)

## 2022-02-19 LAB — C-REACTIVE PROTEIN: CRP: 2 mg/L (ref 0–10)

## 2022-02-20 ENCOUNTER — Encounter: Payer: Self-pay | Admitting: Neurology

## 2022-02-20 ENCOUNTER — Other Ambulatory Visit: Payer: Self-pay | Admitting: *Deleted

## 2022-02-20 DIAGNOSIS — G4733 Obstructive sleep apnea (adult) (pediatric): Secondary | ICD-10-CM

## 2022-02-23 ENCOUNTER — Other Ambulatory Visit: Payer: Self-pay | Admitting: Family Medicine

## 2022-02-23 DIAGNOSIS — E1165 Type 2 diabetes mellitus with hyperglycemia: Secondary | ICD-10-CM

## 2022-03-21 ENCOUNTER — Encounter: Payer: Self-pay | Admitting: Neurology

## 2022-03-21 ENCOUNTER — Ambulatory Visit: Payer: 59 | Admitting: Neurology

## 2022-03-21 VITALS — BP 116/74 | HR 78 | Ht 62.0 in | Wt 187.2 lb

## 2022-03-21 DIAGNOSIS — R519 Headache, unspecified: Secondary | ICD-10-CM

## 2022-03-21 DIAGNOSIS — G4719 Other hypersomnia: Secondary | ICD-10-CM

## 2022-03-21 DIAGNOSIS — R0683 Snoring: Secondary | ICD-10-CM

## 2022-03-21 DIAGNOSIS — R6889 Other general symptoms and signs: Secondary | ICD-10-CM

## 2022-03-21 DIAGNOSIS — E669 Obesity, unspecified: Secondary | ICD-10-CM

## 2022-03-21 NOTE — Patient Instructions (Signed)

## 2022-03-21 NOTE — Progress Notes (Signed)
Subjective:  ?  ?Patient ID: Melissa Vasquez is a 51 y.o. female. ? ?HPI ? ? ? ?Star Age, MD, PhD ?[facility] ?[address] ?[phone] ?[place], [place] [address] ? ?Dear Aliene Beams,  ? ?I saw your patient, [name], upon your kind request in my sleep clinic today for initial consultation of her sleep disorder, in particular, concern for underlying obstructive sleep apnea.  The patient is unaccompanied today.  As you know, Ms. [name] is a 51 year old right-handed woman with an underlying medical history of hypertension, hyperlipidemia, diabetes, depression, allergies and obesity, who reports snoring and excessive daytime somnolence.  I reviewed your office note from 02/18/2022.  Her Epworth sleepiness score is 6/24, fatigue severity score is 39 out of 63.  She does not feel rested, she has had vivid dreams but no nightmares.  She lives with her husband and youngest daughter who is 78, she also has a 65 year old daughter and a 32 year old daughter and 1 grandson.  She drinks caffeine occasionally, not daily in the form of soda.  She quit smoking some 5 years ago and does not currently drink any alcohol.  She is working on weight loss and since August 2022 has lost about 15 pounds.  Her husband has mentioned her loud snoring to her, she has had occasional morning headaches which are dull and achy, not like a migraine.  She has no nightly nocturia and has had better diabetes control and A1c improvement after starting Ozempic.  Bedtime is generally between 10:30 PM and 11 PM and rise time around 6:30 AM.  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  She has no family history of sleep apnea.  Her husband has sleep apnea so she is familiar with the diagnosis and treatment.  She had recent paresthesias which have resolved.  She has been on oral vitamin B12 supplementation. ? ? ?Her Past Medical History Is Significant For: ?Past Medical History:   ?Diagnosis Date  ? HYPERLIPIDEMIA 03/16/2009  ? HYPERTENSION 03/16/2009  ? PANIC DISORDER 03/16/2009  ? ? ?Her Past Surgical History Is Significant For: ?Past Surgical History:  ?Procedure Laterality Date  ? BUNIONECTOMY  1995  ? bilateral  ? [place]  ? ? ?Her Family History Is Significant For: ?Family History  ?Problem Relation Age of Onset  ? Alcohol abuse Other   ? Hyperlipidemia Other   ? Hypertension Other   ? Mental illness Other   ? Diabetes Other   ? Heart disease Other   ? ? ?Her Social History Is Significant For: ?Social History  ? ?Socioeconomic History  ? Marital status: Married  ?  Spouse name: Not on file  ? Number of children: Not on file  ? Years of education: Not on file  ? Highest education level: Not on file  ?Occupational History  ? Not on file  ?Tobacco Use  ? Smoking status: Former  ?  Packs/day: 0.20  ?  Years: 15.00  ?  Pack years: 3.00  ?  Types: Cigarettes  ?  Quit date: 01/02/2009  ?  Years since quitting: 13.2  ? Smokeless tobacco: Never  ?Vaping Use  ? Vaping Use: Never used  ?Substance and Sexual Activity  ? Alcohol use: Not Currently  ? Drug use: Never  ? Sexual activity: Yes  ?Other Topics Concern  ? Not on file  ?Social History Narrative  ? Not on file  ? ?Social Determinants  of Health  ? ?Financial Resource Strain: Not on file  ?Food Insecurity: Not on file  ?Transportation Needs: Not on file  ?Physical Activity: Not on file  ?Stress: Not on file  ?Social Connections: Not on file  ? ? ?Her Allergies Are:  ?No Known Allergies:  ? ?Her Current Medications Are:  ?Outpatient Encounter Medications as of 03/21/2022  ?Medication Sig  ? bisoprolol-hydrochlorothiazide (ZIAC) 5-6.25 MG tablet Take 1 tablet by mouth daily.  ? Cholecalciferol (VITAMIN D3) 125 MCG (5000 UT) CAPS Take by mouth daily.  ? Clobetasol Propionate (TEMOVATE) 0.05 % external spray APPLY TOPICALLY TWICE A DAY  ? FLUoxetine (PROZAC) 20 MG capsule TAKE 1 CAPSULE BY MOUTH EVERY DAY  ? fluticasone (FLONASE)  50 MCG/ACT nasal spray USE 2 SPRAYS IN EACH NOSTRIL ONCE DAILY AT NIGHT  ? losartan (COZAAR) 50 MG tablet Take 1 tablet (50 mg total) by mouth daily.  ? metFORMIN (GLUCOPHAGE) 500 MG tablet TAKE 1 TABLET BY MOUTH 2 TIMES DAILY WITH A MEAL.  ? metroNIDAZOLE (METROGEL) 0.75 % gel metronidazole 0.75 % topical gel ? APPLY TO FACE AT BEDTIME  ? Semaglutide,0.25 or 0.'5MG'$ /DOS, 2 MG/1.5ML SOPN Inject 5 mg into the skin once a week.  ? vitamin B-12 (CYANOCOBALAMIN) 1000 MCG tablet Take 1,000 mcg by mouth daily.  ? [DISCONTINUED] Semaglutide,0.25 or 0.'5MG'$ /DOS, (OZEMPIC, 0.25 OR 0.5 MG/DOSE,) 2 MG/1.5ML SOPN INJECT 0.'25MG'$  INTO THE SKIN ONE TIME PER WEEK  ? ?No facility-administered encounter medications on file as of 03/21/2022.  ?: ? ? ?Review of Systems:  ?Out of a complete 14 point review of systems, all are reviewed and negative with the exception of these symptoms as listed below: ? ?Review of Systems  ?Neurological:   ?     Referral from Dr. Krista Blue, small oropharyngeal airway, nonrefreshing sleep, snoring, dreams. ESS 6, FSS 39.  ? ?Objective:  ?Neurological Exam ? ?Physical Exam ?Physical Examination:  ? ?Vitals:  ? 03/21/22 1113  ?BP: 116/74  ?Pulse: 78  ? ? ?General Examination: The patient is a very pleasant 51 y.o. female in no acute distress. She appears well-developed and well-nourished and well groomed.  ? ?HEENT: Normocephalic, atraumatic, pupils are equal, round and reactive to light, extraocular tracking is good without limitation to gaze excursion or nystagmus noted. Hearing is grossly intact. Face is symmetric with normal facial animation. Speech is clear with no dysarthria noted. There is no hypophonia. There is no lip, neck/head, jaw or voice tremor. Neck is supple with full range of passive and active motion. There are no carotid bruits on auscultation. Oropharynx exam reveals: mild mouth dryness, good dental hygiene and moderate airway crowding, due to small airway entry, tonsillar size of about 1-2+,  slightly wider uvula.  Mallampati class I, mild overbite noted.  Tongue protrudes centrally and palate elevates symmetrically.  Neck circumference of 16-3/8 inches. ? ?Chest: Clear to auscultation without wheezing, rhonchi or crackles noted. ? ?Heart: S1+S2+0, regular and normal without murmurs, rubs or gallops noted.  ? ?Abdomen: Soft, non-tender and non-distended. ? ?Extremities: There is no pitting edema in the distal lower extremities bilaterally.  ? ?Skin: Warm and dry without trophic changes noted.  ? ?Musculoskeletal: exam reveals no obvious joint deformities.  ? ?Neurologically:  ?Mental status: The patient is awake, alert and oriented in all 4 spheres. Her immediate and remote memory, attention, language skills and fund of knowledge are appropriate. There is no evidence of aphasia, agnosia, apraxia or anomia. Speech is clear with normal prosody and enunciation. Thought process  is linear. Mood is normal and affect is normal.  ?Cranial nerves II - XII are as described above under HEENT exam.  ?Motor exam: Normal bulk, strength and tone is noted. There is no tremor, Romberg is negative. Reflexes are 2+ throughout. Fine motor skills and coordination: grossly intact.  ?Cerebellar testing: No dysmetria or intention tremor. There is no truncal or gait ataxia.  ?Sensory exam: intact to light touch in the upper and lower extremities.  ?Gait, station and balance: She stands easily. No veering to one side is noted. No leaning to one side is noted. Posture is age-appropriate and stance is narrow based. Gait shows normal stride length and normal pace. No problems turning are noted.  ?Assessment and Plan:  ? ?In summary, Tisa M Kassin is a very pleasant 51 y.o.-year old female with an underlying medical history of hypertension, hyperlipidemia, diabetes, depression, allergies and obesity, whose history and physical exam are concerning for obstructive sleep apnea (OSA). ?I had a long chat with the patient about my findings  and the diagnosis of OSA, its prognosis and treatment options. We talked about medical treatments, surgical interventions and non-pharmacological approaches. I explained in particular the risks and ramifications of

## 2022-03-26 ENCOUNTER — Telehealth: Payer: Self-pay

## 2022-03-26 NOTE — Telephone Encounter (Signed)
LVM for pt to call back to schedule ?[employer] no auth req  ?

## 2022-03-27 ENCOUNTER — Encounter: Payer: Self-pay | Admitting: Family Medicine

## 2022-03-27 ENCOUNTER — Ambulatory Visit: Payer: 59 | Admitting: Family Medicine

## 2022-03-27 ENCOUNTER — Encounter: Payer: Self-pay | Admitting: Neurology

## 2022-03-27 VITALS — BP 122/70 | HR 77 | Temp 98.0°F | Ht 62.0 in | Wt 185.6 lb

## 2022-03-27 DIAGNOSIS — R42 Dizziness and giddiness: Secondary | ICD-10-CM | POA: Diagnosis not present

## 2022-03-27 NOTE — Progress Notes (Signed)
? ?Established Patient Office Visit ? ?Subjective   ?Patient ID: Melissa Vasquez, female    DOB: Oct 11, 1971  Age: 51 y.o. MRN: [id number] ? ?Chief Complaint  ?Patient presents with  ? Dizziness  ?  Patient complains of dizziness, x2 weeks,   ? ? ?HPI ? ? ?Melissa Vasquez is seen with some recent dizziness.  She recalls on April 29 after rolling over in bed having classic vertigo type symptoms.  She was not sure if this was directional.  After couple days her symptoms seem to improve but then she had another episode yesterday.  This was very similar.  Has not had any syncopal or presyncopal type symptoms.  No consistent or progressive headaches.  No speech changes.  No focal weakness. ? ?She did also recently noticed somewhat of a vibratory sensation in her lower neck and upper thoracic area which seemed to be worse with flexing her neck.  No significant pain.  No radiculitis symptoms.  No upper extremity numbness or weakness. ? ?He has type 2 diabetes and recently went on Ozempic and has had tremendous response with recent A1c 5.9%. ? ?Past Medical History:  ?Diagnosis Date  ? HYPERLIPIDEMIA 03/16/2009  ? HYPERTENSION 03/16/2009  ? PANIC DISORDER 03/16/2009  ? ?Past Surgical History:  ?Procedure Laterality Date  ? BUNIONECTOMY  1995  ? bilateral  ? [place]  ? ? reports that she quit smoking about 13 years ago. Her smoking use included cigarettes. She has a 3.00 pack-year smoking history. She has never used smokeless tobacco. She reports that she does not currently use alcohol. She reports that she does not use drugs. ?family history includes Alcohol abuse in an other family member; Diabetes in an other family member; Heart disease in an other family member; Hyperlipidemia in an other family member; Hypertension in an other family member; Mental illness in an other family member. ?No Known Allergies ? ?Review of Systems  ?Constitutional:  Negative for chills and fever.  ?Eyes:  Negative for blurred vision and  double vision.  ?Respiratory:  Negative for shortness of breath.   ?Cardiovascular:  Negative for chest pain.  ?Genitourinary:  Negative for dysuria.  ?Musculoskeletal:  Negative for neck pain.  ?Neurological:  Positive for dizziness. Negative for speech change, focal weakness, seizures and headaches.  ?Psychiatric/Behavioral:  Negative for memory loss.   ? ?  ?Objective:  ?  ? ?BP 122/70 (BP Location: Left Arm, Patient Position: Sitting, Cuff Size: Normal)   Pulse 77   Temp 98 ?F (36.7 ?C) (Oral)   Ht '5\' 2"'$  (1.575 m)   Wt 185 lb 9.6 oz (84.2 kg)   SpO2 98%   BMI 33.95 kg/m?  ? ? ?Physical Exam ?Vitals reviewed.  ?Constitutional:   ?   Appearance: Normal appearance.  ?Cardiovascular:  ?   Rate and Rhythm: Normal rate and regular rhythm.  ?Pulmonary:  ?   Effort: Pulmonary effort is normal.  ?   Breath sounds: Normal breath sounds.  ?Musculoskeletal:  ?   Cervical back: Neck supple.  ?Neurological:  ?   General: No focal deficit present.  ?   Mental Status: She is alert and oriented to person, place, and time.  ?   Cranial Nerves: No cranial nerve deficit.  ?   Motor: No weakness.  ?   Coordination: Coordination normal.  ?   Comments: No visible nystagmus.  Did have some mild vertigo with sitting to supine with head 45 degrees to left- but not right.    ? ? ? ?  No results found for any visits on 03/27/22. ? ? ? ?The 10-year ASCVD risk score (Arnett DK, et al., 2019) is: 3.9% ? ?  ?Assessment & Plan:  ? ?#1 recent intermittent vertigo symptoms.  Patient currently stable.  She did have mild vertigo symptoms when lying supine with head 45 degrees to the left but not the right.  History and exam suggest probable benign peripheral positional vertigo. ? ?-We gave handout on Epley maneuvers and discussed these.  She will try these and be in touch if having persistent symptoms ? ?No follow-ups on file.  ? ? ?Carolann Littler, MD ? ?

## 2022-03-28 ENCOUNTER — Ambulatory Visit: Payer: 59 | Admitting: Neurology

## 2022-04-03 ENCOUNTER — Ambulatory Visit: Payer: 59 | Admitting: Neurology

## 2022-04-03 DIAGNOSIS — E669 Obesity, unspecified: Secondary | ICD-10-CM

## 2022-04-03 DIAGNOSIS — G4733 Obstructive sleep apnea (adult) (pediatric): Secondary | ICD-10-CM | POA: Diagnosis not present

## 2022-04-03 DIAGNOSIS — R0683 Snoring: Secondary | ICD-10-CM

## 2022-04-03 DIAGNOSIS — R519 Headache, unspecified: Secondary | ICD-10-CM

## 2022-04-03 DIAGNOSIS — R6889 Other general symptoms and signs: Secondary | ICD-10-CM

## 2022-04-03 DIAGNOSIS — G4719 Other hypersomnia: Secondary | ICD-10-CM

## 2022-04-09 ENCOUNTER — Telehealth: Payer: Self-pay

## 2022-04-09 NOTE — Procedures (Signed)
   [facility]  HOME SLEEP TEST (Watch PAT) REPORT  STUDY DATE: 04/03/2022  DOB: [birth year]  MRN: [id number]  ORDERING CLINICIAN: Star Age, MD, PhD   REFERRING CLINICIAN: Dr. Marcial Pacas  CLINICAL INFORMATION/HISTORY: 50 year old right-handed woman with an underlying medical history of hypertension, hyperlipidemia, diabetes, depression, allergies and obesity, who reports snoring and excessive daytime somnolence.  Epworth sleepiness score: 6/24.  BMI: 34.5 kg/m  FINDINGS:   Sleep Summary:   Total Recording Time (hours, min): 9 hours, 21 minutes  Total Sleep Time (hours, min):  8 hours, 24 minutes   Percent REM (%):    25.7%   Respiratory Indices:   Calculated pAHI (per hour):  11.5/hour         REM pAHI:    18.2/hour       NREM pAHI: 9.1/hour  Oxygen Saturation Statistics:    Oxygen Saturation (%) Mean: 94%   Minimum oxygen saturation (%):                 88%   O2 Saturation Range (%): 88-99%    O2 Saturation (minutes) <=88%: 0.5 min  Pulse Rate Statistics:   Pulse Mean (bpm):    75/min    Pulse Range (60-104/min)   IMPRESSION: OSA (obstructive sleep apnea), mild  RECOMMENDATION:  This home sleep test demonstrates overall mild obstructive sleep apnea with a total AHI of 11.5/hour and O2 nadir of 88%.  Intermittent mild to moderate snoring was detected.  Given the patient's medical history and sleep related complaints, treatment with positive airway pressure can be considered as a treatment option. Alternative treatments include weight loss along with avoidance of the supine sleep position, or an oral appliance in appropriate candidates.   Please note that untreated obstructive sleep apnea may carry additional perioperative morbidity. Patients with significant obstructive sleep apnea should receive perioperative PAP therapy and the surgeons and particularly the anesthesiologist should be informed of the diagnosis and the severity of the sleep  disordered breathing. The patient should be cautioned not to drive, work at heights, or operate dangerous or heavy equipment when tired or sleepy. Review and reiteration of good sleep hygiene measures should be pursued with any patient. Other causes of the patient's symptoms, including circadian rhythm disturbances, an underlying mood disorder, medication effect and/or an underlying medical problem cannot be ruled out based on this test. Clinical correlation is recommended.   The patient and her referring provider will be notified of the test results. The patient will be seen in follow up in sleep clinic at [facility].  I certify that I have reviewed the raw data recording prior to the issuance of this report in accordance with the standards of the American Academy of Sleep Medicine (AASM).  INTERPRETING PHYSICIAN:   Star Age, MD, PhD  Board Certified in Neurology and Sleep Medicine  [facility] [address], [place], [place] [address] [phone]

## 2022-04-09 NOTE — Telephone Encounter (Signed)
I called patient. I discussed her sleep study results and recommendations. Patient will consider the treatment options and let us know what she decides. Pt verbalized understanding of results. Pt had no questions at this time but was encouraged to call back if questions arise.

## 2022-04-09 NOTE — Telephone Encounter (Signed)
-----   Message from Star Age, MD sent at 04/09/2022  8:11 AM EDT ----- Patient referred by Dr. Krista Blue, seen by me on 03/21/22, HST 04/03/22.  Please call and notify the patient that the recent sleep test showed overall mild obstructive sleep apnea (OSA). We can consider treatment in the form of autoPAP, which means, that we don't have to bring her in for a sleep study with CPAP, but will let her start using a self adjusting, CPAP like machine at home, through a DME (durable medical equipment) company (of her choice, or as per insurance requirement). The DME representative/respiratory therapist will educate her on how to use the machine, how to put the mask on, etc. I have not placed an order in the chart as yet.  Alternative treatment options may include weight loss with avoiding sleeping on the back/in the supine position and we could consider a dental device in certain patients.  If the patient would like a consultation with a dentist, we can facilitate with a referral.  Let me know how shewants to proceed.  Star Age, MD, PhD [facility])

## 2022-05-08 ENCOUNTER — Other Ambulatory Visit: Payer: Self-pay | Admitting: Family Medicine

## 2022-05-08 ENCOUNTER — Encounter: Payer: Self-pay | Admitting: Family Medicine

## 2022-05-08 DIAGNOSIS — E1165 Type 2 diabetes mellitus with hyperglycemia: Secondary | ICD-10-CM

## 2022-05-09 MED ORDER — SEMAGLUTIDE(0.25 OR 0.5MG/DOS) 2 MG/1.5ML ~~LOC~~ SOPN: PEN_INJECTOR | SUBCUTANEOUS | 5 refills | Status: AC

## 2022-05-26 ENCOUNTER — Other Ambulatory Visit: Payer: Self-pay | Admitting: Family Medicine

## 2022-05-28 ENCOUNTER — Other Ambulatory Visit: Payer: Self-pay | Admitting: Dermatology

## 2022-05-28 DIAGNOSIS — L400 Psoriasis vulgaris: Secondary | ICD-10-CM

## 2022-07-25 ENCOUNTER — Other Ambulatory Visit: Payer: Self-pay | Admitting: Family Medicine

## 2022-08-21 ENCOUNTER — Other Ambulatory Visit: Payer: Self-pay | Admitting: Family Medicine

## 2023-05-19 LAB — HM DIABETES EYE EXAM

## 2023-06-15 ENCOUNTER — Other Ambulatory Visit: Payer: Self-pay | Admitting: Family Medicine

## 2023-08-22 ENCOUNTER — Other Ambulatory Visit: Payer: Self-pay | Admitting: Family Medicine

## 2023-08-22 DIAGNOSIS — Z1211 Encounter for screening for malignant neoplasm of colon: Secondary | ICD-10-CM

## 2023-08-22 DIAGNOSIS — Z1212 Encounter for screening for malignant neoplasm of rectum: Secondary | ICD-10-CM
